# Patient Record
Sex: Female | Born: 1960 | Race: White | Hispanic: No | Smoking: Never smoker
Health system: Southern US, Community
[De-identification: ages and names within clinical notes are randomized; demographics above are authoritative.]

## PROBLEM LIST (undated history)

## (undated) DIAGNOSIS — I10 Essential (primary) hypertension: Secondary | ICD-10-CM

## (undated) DIAGNOSIS — N83209 Unspecified ovarian cyst, unspecified side: Secondary | ICD-10-CM

## (undated) HISTORY — PX: APPENDECTOMY: SHX54

## (undated) HISTORY — DX: Essential (primary) hypertension: I10

## (undated) HISTORY — PX: CHOLECYSTECTOMY: SHX55

## (undated) HISTORY — DX: Unspecified ovarian cyst, unspecified side: N83.209

---

## 2014-01-13 ENCOUNTER — Other Ambulatory Visit: Payer: Self-pay | Admitting: Obstetrics and Gynecology

## 2014-01-13 DIAGNOSIS — N839 Noninflammatory disorder of ovary, fallopian tube and broad ligament, unspecified: Secondary | ICD-10-CM

## 2014-01-27 ENCOUNTER — Telehealth: Payer: Self-pay | Admitting: *Deleted

## 2014-01-27 NOTE — Telephone Encounter (Signed)
[facility] [phone] for [facility], Dr. Hervey Ard Secretary to fax over copy of MRI, prior copy blurry and Path report from Endometrial Biopsy. Paper work needed prior to appt scheduled.

## 2014-02-11 ENCOUNTER — Encounter: Payer: Self-pay | Admitting: Gynecologic Oncology

## 2014-02-13 ENCOUNTER — Encounter: Payer: Self-pay | Admitting: Gynecologic Oncology

## 2014-02-13 ENCOUNTER — Ambulatory Visit: Payer: PRIVATE HEALTH INSURANCE | Attending: Gynecologic Oncology | Admitting: Gynecologic Oncology

## 2014-02-13 VITALS — BP 188/100 | HR 77 | Temp 97.9°F | Resp 16 | Ht 62.05 in | Wt 190.0 lb

## 2014-02-13 DIAGNOSIS — N939 Abnormal uterine and vaginal bleeding, unspecified: Secondary | ICD-10-CM | POA: Insufficient documentation

## 2014-02-13 DIAGNOSIS — N926 Irregular menstruation, unspecified: Secondary | ICD-10-CM | POA: Insufficient documentation

## 2014-02-13 DIAGNOSIS — R1900 Intra-abdominal and pelvic swelling, mass and lump, unspecified site: Secondary | ICD-10-CM

## 2014-02-13 DIAGNOSIS — N9489 Other specified conditions associated with female genital organs and menstrual cycle: Secondary | ICD-10-CM | POA: Insufficient documentation

## 2014-02-13 DIAGNOSIS — N83209 Unspecified ovarian cyst, unspecified side: Secondary | ICD-10-CM | POA: Insufficient documentation

## 2014-02-13 NOTE — Progress Notes (Signed)
Consult Note: Gyn-Onc  Consult was requested by Dr. Jeralene Peters for the evaluation of Rebecca Mills 53 y.o. female  CC:  Chief Complaint  Patient presents with  . Pelvic mass    New Consult    Assessment/Plan:   Ms. Rebecca Mills  is a 53 y.o.  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 CA 125 is within normal limits.  Rebecca Mills also has significant uterine bleeding.  Hysteroscopy was unremarkable and the pathology demonstrated inactive endometrium with stromal breakdown. She continues to have significant uterine bleeding and denies receiving hormonal management. The recommendation made was for hysterectomy bilateral salpingectomy right oophorectomy possible bilateral oophorectomy. The patient is aware that if malignancy is encountered the procedure be extended to be inclusive of omentectomy lymph node dissection debulking another indicated procedures.  The risks and benefits of the procedure were discussed with the patient and her son. All of their questions were answered to their satisfaction.  Rebecca Mills is aware that Dr. Marti Sleigh will be the surgeon for that the procedure will occur on 02/25/2014.   HPI: Ms. Rebecca Mills  is a 53 y.o.  gravida 3 para 3 last normal menstrual period 11/25/2013. Ms. Rebecca Mills reports one year of menorrhagia associated with daily spotting between menses.  She presented to Dr. Nyoka Cowden for evaluation of abnormal uterine bleeding.   On 01/13/2014 she underwent an MRI. Findings were notable for a 10.7 x 7.6 x 8.2 cm multicystic mass in the right adnexal area. The largest cystic component measures 8.3 cm. 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 The left ovary measured 3.7 x 4.1 x 3.6 with numerous multiple follicles. In the left adnexa is no evidence for wall irregularity thickening or nodularity. The uterus was  noted to measure 10.0 x 8.8 x 5.9 cm subserosal fibroids posteriorly.  On 01/23/2014 she underwent hysteroscopy with D&C. The findings were notable for uterus was sounded to 10 cm. The cavitary appeared normal thin menopausal appearing with no endometrial polyps a small benign endocervical polyp was seen and removed. Pathology from that procedure was notable for inactive endometrium with stromal breakdown and endocervical polyp with tubal metaplasia. The endocervical curettage was negative for malignancy. Patient continued to have spotting.  CA 125 on 01/21/2014 returned to value of 28.2.  Ms. Rebecca Mills reports right sided pelvic pain for 1 year the pain is worsened with exertion and heavy lifting. She denies associated nausea. She states there is some radiation of the pain to the right anterior thigh.  She notes an intentional 6 pound weight loss states her appetite is good has occasional bloating is no change in her bowel or bladder habits. Even since the hysteroscopy she continues to have daily if the bleeding   Current Meds:  Outpatient Encounter Prescriptions as of 02/13/2014  Medication Sig  . amLODipine (NORVASC) 10 MG tablet Take 10 mg by mouth daily.  . cloNIDine (CATAPRES) 0.1 MG tablet Take 0.1 mg by mouth daily.  . hydrochlorothiazide (HYDRODIURIL) 25 MG tablet Take 25 mg by mouth daily.  Marland Kitchen losartan (COZAAR) 100 MG tablet Take 100 mg by mouth daily.  . metoprolol (LOPRESSOR) 50 MG tablet Take 50 mg by mouth 2 (two) times daily.  . Multiple Vitamin (MULTI VITAMIN DAILY PO) Take by mouth daily.    Allergy: No Known Allergies  Social Hx:   History   Social History  .  Marital Status: Married    Spouse Name: N/A    Number of Children: N/A  . Years of Education: N/A   Occupational History  . Not on file.   Social History Main Topics  . Smoking status: Never Smoker   . Smokeless tobacco: Not on file  . Alcohol Use: No  . Drug Use: No  . Sexual Activity: Yes   Other  Topics Concern  . Not on file   Social History Narrative  . No narrative on file    Past Surgical Hx:  Past Surgical History  Procedure Laterality Date  . Appendectomy    . Cholecystectomy      Past Medical Hx:  Past Medical History  Diagnosis Date  . Hypertension   . Ovarian cystic mass     Past Gynecological History:  G3P3 Menarche 14 regular menses until 1 year ago.  LNMP 11/2013.  Pap  01/01/2014 was negative HPV negative  Family Hx:  Family History  Problem Relation Age of Onset  . Hypertension Mother   . Thyroid cancer Mother   . Hypertension Father   . Kidney cancer Father   . Hypertension Sister   . Hypertension Brother     Review of Systems: Constitutional  Feels well, Cardiovascular  No chest pain, shortness of breath, or edema  Pulmonary  No cough or wheeze.  Gastro Intestinal  No nausea, vomitting, or diarrhoea. No bright red blood per rectum, RLQ abdominal pain with lifting.  No change in bowel movement, or constipation.  Genito Urinary  No frequency, urgency, dysuria, Continuous vaginal bleeding Musculo Skeletal  No myalgia, arthralgia, joint swelling or pain  Neurologic  No weakness, numbness, change in gait,  Psychology  No depression, anxiety, insomnia.   Vitals:  Blood pressure 188/100, pulse 77, temperature 97.9 F (36.6 C), temperature source Oral, resp. rate 16, height 5' 2.05" (1.576 m), weight 190 lb (86.183 kg).  Physical Exam: WD in NAD Neck  Supple NROM, without any enlargements.  Lymph Node Survey No cervical supraclavicular or inguinal adenopathy Cardiovascular  Pulse normal rate, regularity and rhythm.  Lungs  Clear to auscultation bilateraly,  Psychiatry  Alert and oriented, appropriate mood and affect. Abdomen  Normoactive bowel sounds, abdomen soft, non-tender and obese. No omental cake. Back No CVA tenderness Genito Urinary  Vulva/vagina: Normal external female genitalia.  No lesions. No  discharge.  Bladder/urethra:  No lesions or masses  Vagina: Significant blood in the vault.  Cervix: Normal appearing, no lesions.  Uterus:Unable to assess size no parametrial involvement or nodularity.  Adnexa: No palpable masses. Rectal  Good tone, no masses no cul de sac nodularity.  Extremities  No bilateral cyanosis, clubbing or edema.   Janie Morning, MD, PhD 02/13/2014, 4:46 PM

## 2014-02-13 NOTE — Patient Instructions (Signed)
Plan for surgery on May 5 with Dr. Fermin Schwab at [facility].                Preparing for your Surgery  Pre-operative Testing -You will receive a phone call from presurgical testing at [facility] to arrange for a pre-operative testing appointment before your surgery.  This appointment normally occurs one to two weeks before your scheduled surgery.   -Bring your insurance card, copy of an advanced directive if applicable, medication list  -At that visit, you will be asked to sign a consent for a possible blood transfusion in case a transfusion becomes necessary during surgery.  The need for a blood transfusion is rare but having consent is a necessary part of your care.     Day Before Surgery at [place] will be asked to take in only clear liquids the day before surgery.  Examples of clear liquids include broths, jello, and clear juices.  You will be advised to perform a fleets enema the night before your surgery.  You will be advised to have nothing to eat or drink after midnight the evening before.    Your role in recovery Your role is to become active as soon as directed by your doctor, while still giving yourself time to heal.  Rest when you feel tired. You will be asked to do the following in order to speed your recovery:  - Cough and breathe deeply. This helps toclear and expand your lungs and can prevent pneumonia. You may be given a spirometer to practice deep breathing. A staff member will show you how to use the spirometer. - Do mild physical activity. Walking or moving your legs help your circulation and body functions return to normal. A staff member will help you when you try to walk and will provide you with simple exercises. Do not try to get up or walk alone the first time. - Actively manage your pain. Managing your pain lets you move in comfort. We will ask you to rate your pain on a scale of zero to 10. It is your responsibility to tell your doctor or  nurse where and how much you hurt so your pain can be treated.  Special Considerations -If you are diabetic, you may be placed on insulin after surgery to have closer control over your blood sugars to promote healing and recovery.  This does not mean that you will be discharged on insulin.  If applicable, your oral antidiabetics will be resumed when you are tolerating a solid diet.  -Your final pathology results from surgery should be available by the Friday after surgery and the results will be relayed to you when available.  Fleets Enema What is this medicine? SODIUM PHOSPATE SALT (SOE dee um FOS fate sawlt) is a saline laxative. It is used to treat constipation or to clean the bowel before a colonoscopy. This medicine may be used for other purposes; ask your health care provider or pharmacist if you have questions. COMMON BRAND NAME(S): Fleet, Ready To Use Saline What should I tell my health care provider before I take this medicine? They need to know if you have any of these conditions: -abnormal blood levels of electrolytes like sodium, phosphate, potassium or calcium -bowel problems like colitis, constipation, and obstruction -change in bowel habits lasting more than 2 weeks -chest pain -dehydration -heart failure -kidney disease -on low salt or sodium diet -stomach pain, nausea, vomiting -an unusual or allergic reaction to sodium phosphate, other  medicines, foods, dyes, or preservatives -pregnant or trying to get pregnant -breast-feeding How should I use this medicine? This medicine is for rectal use only. Do not take by mouth. Follow the directions on the prescription label. Wash your hands before and after use. Remove tip from enema bottle. Gently insert enema tip into the rectum. Squeeze bottle until almost all of the medicine is inside the rectum. Remove enema tip from the rectum and stay in position until the urge to evacuate is strong. Do not take doses that are larger than  those recommended on the product label or otherwise directed by your healthcare professional. Do not take more than one dose in 24 hours. Talk to your pediatrician regarding the use of this medicine in children. While this drug may be prescribed for children as young as 37 years old for selected conditions, precautions do apply. Overdosage: If you think you have taken too much of this medicine contact a poison control center or emergency room at once. NOTE: This medicine is only for you. Do not share this medicine with others. What if I miss a dose? This does not apply; this medicine is not for regular use. What may interact with this medicine? -aspirin -certain medicines used to treat high blood pressure, like captopril, enalapril, lisinopril, or candesartan, losartan, valsartan -diuretics -NSAIDS, medicines for pain and inflammation, like ibuprofen or naproxen This list may not describe all possible interactions. Give your health care provider a list of all the medicines, herbs, non-prescription drugs, or dietary supplements you use. Also tell them if you smoke, drink alcohol, or use illegal drugs. Some items may interact with your medicine. What should I watch for while using this medicine? Do not use with any other laxatives unless your doctor tells you to. Drink fluids as directed to prevent dehydration. See your doctor right away if you do not have a bowel movement after using this medicine. What side effects may I notice from receiving this medicine? Side effects that you should report to your doctor or health care professional as soon as possible: -allergic reactions like skin rash, itching or hives, swelling of the face, lips, or tongue -irregular heart beat -rectal bleeding -seizures Side effects that usually do not require medical attention (report to your doctor or health care professional if they continue or are bothersome): -bloating -dizziness -headache -nausea and  vomiting -stomach pain This list may not describe all possible side effects. Call your doctor for medical advice about side effects. You may report side effects to FDA at 1-800-FDA-1088. Where should I keep my medicine? Keep out of the reach of children. Store at room temperature between 15 and 30 degrees C (59 and 86 degrees F). Throw away any unused medicine after the expiration date. NOTE: This sheet is a summary. It may not cover all possible information. If you have questions about this medicine, talk to your doctor, pharmacist, or health care provider.  2014, [employer]. (2012-11-01 14:54:06)

## 2014-02-18 ENCOUNTER — Ambulatory Visit (HOSPITAL_COMMUNITY)
Admission: RE | Admit: 2014-02-18 | Discharge: 2014-02-18 | Disposition: A | Discharge status: Home / Self Care | Payer: PRIVATE HEALTH INSURANCE | Source: Ambulatory Visit | Attending: Anesthesiology | Admitting: Anesthesiology

## 2014-02-18 ENCOUNTER — Encounter (HOSPITAL_COMMUNITY): Payer: Self-pay | Admitting: Pharmacy Technician

## 2014-02-18 ENCOUNTER — Encounter (HOSPITAL_COMMUNITY): Payer: Self-pay

## 2014-02-18 ENCOUNTER — Encounter (HOSPITAL_COMMUNITY)
Admission: RE | Admit: 2014-02-18 | Discharge: 2014-02-18 | Disposition: A | Discharge status: Home / Self Care | Payer: PRIVATE HEALTH INSURANCE | Source: Ambulatory Visit | Attending: Obstetrics & Gynecology | Admitting: Obstetrics & Gynecology

## 2014-02-18 DIAGNOSIS — Z01812 Encounter for preprocedural laboratory examination: Secondary | ICD-10-CM | POA: Insufficient documentation

## 2014-02-18 DIAGNOSIS — Z01818 Encounter for other preprocedural examination: Secondary | ICD-10-CM | POA: Insufficient documentation

## 2014-02-18 DIAGNOSIS — Z01810 Encounter for preprocedural cardiovascular examination: Secondary | ICD-10-CM | POA: Insufficient documentation

## 2014-02-18 LAB — CBC WITH DIFFERENTIAL/PLATELET
BASOS ABS: 0.1 10*3/uL (ref 0.0–0.1)
BASOS PCT: 1 % (ref 0–1)
EOS ABS: 0.4 10*3/uL (ref 0.0–0.7)
EOS PCT: 5 % (ref 0–5)
HEMATOCRIT: 36.7 % (ref 36.0–46.0)
HEMOGLOBIN: 13 g/dL (ref 12.0–15.0)
Lymphocytes Relative: 18 % (ref 12–46)
Lymphs Abs: 1.4 10*3/uL (ref 0.7–4.0)
MCH: 30 pg (ref 26.0–34.0)
MCHC: 35.4 g/dL (ref 30.0–36.0)
MCV: 84.8 fL (ref 78.0–100.0)
MONO ABS: 0.5 10*3/uL (ref 0.1–1.0)
MONOS PCT: 7 % (ref 3–12)
Neutro Abs: 5.4 10*3/uL (ref 1.7–7.7)
Neutrophils Relative %: 70 % (ref 43–77)
Platelets: 237 10*3/uL (ref 150–400)
RBC: 4.33 MIL/uL (ref 3.87–5.11)
RDW: 13.1 % (ref 11.5–15.5)
WBC: 7.8 10*3/uL (ref 4.0–10.5)

## 2014-02-18 LAB — COMPREHENSIVE METABOLIC PANEL
ALBUMIN: 4 g/dL (ref 3.5–5.2)
ALT: 17 U/L (ref 0–35)
AST: 20 U/L (ref 0–37)
Alkaline Phosphatase: 88 U/L (ref 39–117)
BUN: 16 mg/dL (ref 6–23)
CALCIUM: 10.9 mg/dL — AB (ref 8.4–10.5)
CO2: 26 mEq/L (ref 19–32)
CREATININE: 0.84 mg/dL (ref 0.50–1.10)
Chloride: 102 mEq/L (ref 96–112)
GFR calc Af Amer: >90 mL/min (ref >90)
GFR calc non Af Amer: 79 mL/min — ABNORMAL LOW (ref >90)
Glucose, Bld: 112 mg/dL — ABNORMAL HIGH (ref 70–99)
Potassium: 4.1 mEq/L (ref 3.7–5.3)
Sodium: 139 mEq/L (ref 137–147)
TOTAL PROTEIN: 7.4 g/dL (ref 6.0–8.3)
Total Bilirubin: 0.7 mg/dL (ref 0.3–1.2)

## 2014-02-18 LAB — URINALYSIS, ROUTINE W REFLEX MICROSCOPIC
Bilirubin Urine: NEGATIVE
GLUCOSE, UA: NEGATIVE mg/dL
Ketones, ur: NEGATIVE mg/dL
LEUKOCYTES UA: NEGATIVE
Nitrite: NEGATIVE
PROTEIN: NEGATIVE mg/dL
SPECIFIC GRAVITY, URINE: 1.006 (ref 1.005–1.030)
UROBILINOGEN UA: 0.2 mg/dL (ref 0.0–1.0)
pH: 7 (ref 5.0–8.0)

## 2014-02-18 LAB — URINE MICROSCOPIC-ADD ON

## 2014-02-18 LAB — HCG, SERUM, QUALITATIVE: PREG SERUM: NEGATIVE

## 2014-02-18 NOTE — Pre-Procedure Instructions (Signed)
EKG AND CXR WERE DONE TODAY PREOP AT [facility] AS PER ANESTHESIOLOGIST'S GUIDELINES. 

## 2014-02-18 NOTE — Patient Instructions (Signed)
CLEAR LIQUID DIET ALL DAY - THE DAY BEFORE YOUR SURGERY - SEE ATTACHED LIST.  FLEETS ENEMA THE NIGHT BEFORE YOUR SURGERY.  YOUR SURGERY IS SCHEDULED AT [facility]  ON:  Tuesday  5/5  REPORT TO  SHORT STAY CENTER AT:  5:15 AM      PHONE # FOR SHORT STAY IS [phone]  DO NOT EAT OR DRINK ANYTHING AFTER MIDNIGHT THE NIGHT BEFORE YOUR SURGERY.  YOU MAY BRUSH YOUR TEETH, RINSE OUT YOUR MOUTH--BUT NO WATER, NO FOOD, NO CHEWING GUM, NO MINTS, NO CANDIES, NO CHEWING TOBACCO.  PLEASE TAKE THE FOLLOWING MEDICATIONS THE AM OF YOUR SURGERY WITH A FEW SIPS OF WATER:  AMLODIPINE, CATAPRES, METOPROLOL.    DO NOT BRING VALUABLES, MONEY, CREDIT CARDS.  DO NOT WEAR JEWELRY, MAKE-UP, NAIL POLISH AND NO METAL PINS OR CLIPS IN YOUR HAIR. CONTACT LENS, DENTURES / PARTIALS, GLASSES SHOULD NOT BE WORN TO SURGERY AND IN MOST CASES-HEARING AIDS WILL NEED TO BE REMOVED.  BRING YOUR GLASSES CASE, ANY EQUIPMENT NEEDED FOR YOUR CONTACT LENS. FOR PATIENTS ADMITTED TO THE HOSPITAL--CHECK OUT TIME THE DAY OF DISCHARGE IS 11:00 AM.  ALL INPATIENT ROOMS ARE PRIVATE - WITH BATHROOM, TELEPHONE, TELEVISION AND WIFI INTERNET.                                                    PLEASE READ OVER ANY  FACT SHEETS THAT YOU WERE GIVEN: BLOOD TRANSFUSION INFORMATION, INCENTIVE SPIROMETER INFORMATION.  AFTER YOUR SURGERY - REMEMBER TO DEEP BREATHE, COUGH TO HELP PREVENT LUNG COMPLICATIONS,  TURN IN BED FREQUENTLY AND MOVE YOUR LEGS UP AND DOWN TO HELP PREVENT BLOOD CLOTS.  PLEASE BE AWARE THAT YOU MAY NEED ADDITIONAL BLOOD DRAWN DAY OF YOUR SURGERY  _______________________________________________________________________   [facility] - Preparing for Surgery Before surgery, you can play an important role.  Because skin is not sterile, your skin needs to be as free of germs as possible.  You can reduce the number of germs on your skin by washing with CHG (chlorahexidine gluconate) soap before surgery.  CHG is an  antiseptic cleaner which kills germs and bonds with the skin to continue killing germs even after washing. Please DO NOT use if you have an allergy to CHG or antibacterial soaps.  If your skin becomes reddened/irritated stop using the CHG and inform your nurse when you arrive at Short Stay. Do not shave (including legs and underarms) for at least 48 hours prior to the first CHG shower.  You may shave your face. Please follow these instructions carefully:  1.  Shower with CHG Soap the night before surgery and the  morning of Surgery.  2.  If you choose to wash your hair, wash your hair first as usual with your  normal  shampoo.  3.  After you shampoo, rinse your hair and body thoroughly to remove the  shampoo.                           4.  Use CHG as you would any other liquid soap.  You can apply chg directly  to the skin and wash                       Gently with a scrungie or clean washcloth.  5.  Apply the CHG Soap to your body ONLY FROM THE NECK DOWN.   Do not use on open                           Wound or open sores. Avoid contact with eyes, ears mouth and genitals (private parts).                        Genitals (private parts) with your normal soap.             6.  Wash thoroughly, paying special attention to the area where your surgery  will be performed.  7.  Thoroughly rinse your body with warm water from the neck down.  8.  DO NOT shower/wash with your normal soap after using and rinsing off  the CHG Soap.                9.  Pat yourself dry with a clean towel.            10.  Wear clean pajamas.            11.  Place clean sheets on your bed the night of your first shower and do not  sleep with pets. Day of Surgery : Do not apply any lotions/deodorants the morning of surgery.  Please wear clean clothes to the hospital/surgery center.  FAILURE TO FOLLOW THESE INSTRUCTIONS MAY RESULT IN THE CANCELLATION OF YOUR SURGERY PATIENT SIGNATURE_________________________________  NURSE  SIGNATURE__________________________________  ________________________________________________________________________    CLEAR LIQUID DIET   Foods Allowed                                                                     Foods Excluded  Coffee and tea, regular and decaf                             liquids that you cannot  Plain Jell-O in any flavor                                             see through such as: Fruit ices (not with fruit pulp)                                     milk, soups, orange juice  Iced Popsicles                                    All solid food Carbonated beverages, regular and diet                                    Cranberry, grape and apple juices Sports drinks like Gatorade Lightly seasoned clear broth or consume(fat free) Sugar, honey syrup  Sample Menu Breakfast  Lunch                                     Supper Cranberry juice                    Beef broth                            Chicken broth Jell-O                                     Grape juice                           Apple juice Coffee or tea                        Jell-O                                      Popsicle                                                Coffee or tea                        Coffee or tea  _____________________________________________________________________   WHAT IS A BLOOD TRANSFUSION? Blood Transfusion Information  A transfusion is the replacement of blood or some of its parts. Blood is made up of multiple cells which provide different functions.  Red blood cells carry oxygen and are used for blood loss replacement.  White blood cells fight against infection.  Platelets control bleeding.  Plasma helps clot blood.  Other blood products are available for specialized needs, such as hemophilia or other clotting disorders. BEFORE THE TRANSFUSION  Who gives blood for transfusions?   Healthy volunteers who are fully evaluated  to make sure their blood is safe. This is blood bank blood. Transfusion therapy is the safest it has ever been in the practice of medicine. Before blood is taken from a donor, a complete history is taken to make sure that person has no history of diseases nor engages in risky social behavior (examples are intravenous drug use or sexual activity with multiple partners). The donor's travel history is screened to minimize risk of transmitting infections, such as malaria. The donated blood is tested for signs of infectious diseases, such as HIV and hepatitis. The blood is then tested to be sure it is compatible with you in order to minimize the chance of a transfusion reaction. If you or a relative donates blood, this is often done in anticipation of surgery and is not appropriate for emergency situations. It takes many days to process the donated blood. RISKS AND COMPLICATIONS Although transfusion therapy is very safe and saves many lives, the main dangers of transfusion include:   Getting an infectious disease.  Developing a transfusion reaction. This is an allergic reaction to something in the blood you were given. Every precaution is taken to prevent this. The decision to have  a blood transfusion has been considered carefully by your caregiver before blood is given. Blood is not given unless the benefits outweigh the risks. AFTER THE TRANSFUSION  Right after receiving a blood transfusion, you will usually feel much better and more energetic. This is especially true if your red blood cells have gotten low (anemic). The transfusion raises the level of the red blood cells which carry oxygen, and this usually causes an energy increase.  The nurse administering the transfusion will monitor you carefully for complications. HOME CARE INSTRUCTIONS  No special instructions are needed after a transfusion. You may find your energy is better. Speak with your caregiver about any limitations on activity for  underlying diseases you may have. SEEK MEDICAL CARE IF:   Your condition is not improving after your transfusion.  You develop redness or irritation at the intravenous (IV) site. SEEK IMMEDIATE MEDICAL CARE IF:  Any of the following symptoms occur over the next 12 hours:  Shaking chills.  You have a temperature by mouth above 102 F (38.9 C), not controlled by medicine.  Chest, back, or muscle pain.  People around you feel you are not acting correctly or are confused.  Shortness of breath or difficulty breathing.  Dizziness and fainting.  You get a rash or develop hives.  You have a decrease in urine output.  Your urine turns a dark color or changes to pink, red, or brown. Any of the following symptoms occur over the next 10 days:  You have a temperature by mouth above 102 F (38.9 C), not controlled by medicine.  Shortness of breath.  Weakness after normal activity.  The white part of the eye turns yellow (jaundice).  You have a decrease in the amount of urine or are urinating less often.  Your urine turns a dark color or changes to pink, red, or brown. Document Released: 10/07/2000 Document Revised: 01/02/2012 Document Reviewed: 05/26/2008 ExitCare Patient Information 2014 [place].  _______________________________________________________________________  Incentive Spirometer  An incentive spirometer is a tool that can help keep your lungs clear and active. This tool measures how well you are filling your lungs with each breath. Taking long deep breaths may help reverse or decrease the chance of developing breathing (pulmonary) problems (especially infection) following:  A long period of time when you are unable to move or be active. BEFORE THE PROCEDURE   If the spirometer includes an indicator to show your best effort, your nurse or respiratory therapist will set it to a desired goal.  If possible, sit up straight or lean slightly forward. Try not to  slouch.  Hold the incentive spirometer in an upright position. INSTRUCTIONS FOR USE  1. Sit on the edge of your bed if possible, or sit up as far as you can in bed or on a chair. 2. Hold the incentive spirometer in an upright position. 3. Breathe out normally. 4. Place the mouthpiece in your mouth and seal your lips tightly around it. 5. Breathe in slowly and as deeply as possible, raising the piston or the ball toward the top of the column. 6. Hold your breath for 3-5 seconds or for as long as possible. Allow the piston or ball to fall to the bottom of the column. 7. Remove the mouthpiece from your mouth and breathe out normally. 8. Rest for a few seconds and repeat Steps 1 through 7 at least 10 times every 1-2 hours when you are awake. Take your time and take a few normal breaths between  deep breaths. 9. The spirometer may include an indicator to show your best effort. Use the indicator as a goal to work toward during each repetition. 10. After each set of 10 deep breaths, practice coughing to be sure your lungs are clear. If you have an incision (the cut made at the time of surgery), support your incision when coughing by placing a pillow or rolled up towels firmly against it. Once you are able to get out of bed, walk around indoors and cough well. You may stop using the incentive spirometer when instructed by your caregiver.  RISKS AND COMPLICATIONS  Take your time so you do not get dizzy or light-headed.  If you are in pain, you may need to take or ask for pain medication before doing incentive spirometry. It is harder to take a deep breath if you are having pain. AFTER USE  Rest and breathe slowly and easily.  It can be helpful to keep track of a log of your progress. Your caregiver can provide you with a simple table to help with this. If you are using the spirometer at home, follow these instructions: [place] IF:   You are having difficultly using the spirometer.  You  have trouble using the spirometer as often as instructed.  Your pain medication is not giving enough relief while using the spirometer.  You develop fever of 100.5 F (38.1 C) or higher. SEEK IMMEDIATE MEDICAL CARE IF:   You cough up bloody sputum that had not been present before.  You develop fever of 102 F (38.9 C) or greater.  You develop worsening pain at or near the incision site. MAKE SURE YOU:   Understand these instructions.  Will watch your condition.  Will get help right away if you are not doing well or get worse. Document Released: 02/20/2007 Document Revised: 01/02/2012 Document Reviewed: 04/23/2007 [facility] Patient Information 2014 [place], [place].   ________________________________________________________________________

## 2014-02-24 ENCOUNTER — Encounter (HOSPITAL_COMMUNITY): Payer: Self-pay | Admitting: Anesthesiology

## 2014-02-24 ENCOUNTER — Telehealth: Payer: Self-pay | Admitting: *Deleted

## 2014-02-24 NOTE — Anesthesia Preprocedure Evaluation (Addendum)
Anesthesia Evaluation  Patient identified by MRN, date of birth, ID band Patient awake    Reviewed: Allergy & Precautions, H&P , NPO status , Patient's Chart, lab work & pertinent test results  Airway Mallampati: II TM Distance: >3 FB Neck ROM: Full    Dental  (+) Poor Dentition, Dental Advisory Given Poor dentition.  Dental Caries throughout with discoloration:   Pulmonary neg pulmonary ROS,  breath sounds clear to auscultation  Pulmonary exam normal       Cardiovascular hypertension, Pt. on medications Rhythm:Regular Rate:Normal     Neuro/Psych negative neurological ROS  negative psych ROS   GI/Hepatic negative GI ROS, Neg liver ROS,   Endo/Other  negative endocrine ROS  Renal/GU negative Renal ROS  negative genitourinary   Musculoskeletal negative musculoskeletal ROS (+)   Abdominal (+) + obese,   Peds negative pediatric ROS (+)  Hematology negative hematology ROS (+)   Anesthesia Other Findings   Reproductive/Obstetrics negative OB ROS                          Anesthesia Physical Anesthesia Plan  ASA: II  Anesthesia Plan: General   Post-op Pain Management:    Induction: Intravenous  Airway Management Planned: Oral ETT  Additional Equipment:   Intra-op Plan:   Post-operative Plan: Extubation in OR  Informed Consent: I have reviewed the patients History and Physical, chart, labs and discussed the procedure including the risks, benefits and alternatives for the proposed anesthesia with the patient or authorized representative who has indicated his/her understanding and acceptance.   Dental advisory given  Plan Discussed with: CRNA  Anesthesia Plan Comments:         Anesthesia Quick Evaluation

## 2014-02-24 NOTE — Telephone Encounter (Signed)
Called pt with reminder to have clear liquids today, npo after midnight, fleets enema night before

## 2014-02-25 ENCOUNTER — Encounter (HOSPITAL_COMMUNITY): Payer: PRIVATE HEALTH INSURANCE | Admitting: Anesthesiology

## 2014-02-25 ENCOUNTER — Encounter (HOSPITAL_COMMUNITY): Payer: Self-pay | Admitting: *Deleted

## 2014-02-25 ENCOUNTER — Encounter (HOSPITAL_COMMUNITY)
Admission: RE | Disposition: A | Discharge status: Home / Self Care | Payer: Self-pay | Source: Ambulatory Visit | Attending: Obstetrics & Gynecology

## 2014-02-25 ENCOUNTER — Inpatient Hospital Stay (HOSPITAL_COMMUNITY): Payer: PRIVATE HEALTH INSURANCE | Admitting: Anesthesiology

## 2014-02-25 ENCOUNTER — Inpatient Hospital Stay (HOSPITAL_COMMUNITY)
Admission: RE | Admit: 2014-02-25 | Discharge: 2014-02-27 | DRG: 743 | Disposition: A | Discharge status: Home / Self Care | Payer: PRIVATE HEALTH INSURANCE | Source: Ambulatory Visit | Attending: Obstetrics & Gynecology | Admitting: Obstetrics & Gynecology

## 2014-02-25 DIAGNOSIS — Z808 Family history of malignant neoplasm of other organs or systems: Secondary | ICD-10-CM

## 2014-02-25 DIAGNOSIS — Z9089 Acquired absence of other organs: Secondary | ICD-10-CM

## 2014-02-25 DIAGNOSIS — Z8249 Family history of ischemic heart disease and other diseases of the circulatory system: Secondary | ICD-10-CM

## 2014-02-25 DIAGNOSIS — I10 Essential (primary) hypertension: Secondary | ICD-10-CM | POA: Diagnosis present

## 2014-02-25 DIAGNOSIS — N83209 Unspecified ovarian cyst, unspecified side: Secondary | ICD-10-CM

## 2014-02-25 DIAGNOSIS — R1900 Intra-abdominal and pelvic swelling, mass and lump, unspecified site: Secondary | ICD-10-CM | POA: Diagnosis present

## 2014-02-25 DIAGNOSIS — N838 Other noninflammatory disorders of ovary, fallopian tube and broad ligament: Secondary | ICD-10-CM | POA: Diagnosis present

## 2014-02-25 DIAGNOSIS — Z8051 Family history of malignant neoplasm of kidney: Secondary | ICD-10-CM

## 2014-02-25 DIAGNOSIS — N920 Excessive and frequent menstruation with regular cycle: Secondary | ICD-10-CM | POA: Diagnosis present

## 2014-02-25 DIAGNOSIS — D3910 Neoplasm of uncertain behavior of unspecified ovary: Secondary | ICD-10-CM

## 2014-02-25 DIAGNOSIS — N9489 Other specified conditions associated with female genital organs and menstrual cycle: Secondary | ICD-10-CM | POA: Diagnosis present

## 2014-02-25 DIAGNOSIS — D252 Subserosal leiomyoma of uterus: Principal | ICD-10-CM | POA: Diagnosis present

## 2014-02-25 HISTORY — PX: ABDOMINAL HYSTERECTOMY: SHX81

## 2014-02-25 HISTORY — PX: SALPINGOOPHORECTOMY: SHX82

## 2014-02-25 LAB — TYPE AND SCREEN
ABO/RH(D): O POS
Antibody Screen: NEGATIVE

## 2014-02-25 LAB — ABO/RH: ABO/RH(D): O POS

## 2014-02-25 SURGERY — HYSTERECTOMY, ABDOMINAL
Anesthesia: General

## 2014-02-25 MED ORDER — BUPIVACAINE LIPOSOME 1.3 % IJ SUSP
INTRAMUSCULAR | Status: DC | PRN
  Administered 2014-02-25: 20 mL

## 2014-02-25 MED ORDER — GLYCOPYRROLATE 0.2 MG/ML IJ SOLN
INTRAMUSCULAR | Status: AC
  Filled 2014-02-25: qty 3

## 2014-02-25 MED ORDER — MIDAZOLAM HCL 5 MG/5ML IJ SOLN
INTRAMUSCULAR | Status: DC | PRN
  Administered 2014-02-25: 2 mg via INTRAVENOUS

## 2014-02-25 MED ORDER — AMLODIPINE BESYLATE 10 MG PO TABS
10.0000 mg | ORAL_TABLET | Freq: Every morning | ORAL | Status: DC
  Administered 2014-02-26 – 2014-02-27 (×2): 10 mg via ORAL
  Filled 2014-02-25 (×3): qty 1

## 2014-02-25 MED ORDER — PROMETHAZINE HCL 25 MG/ML IJ SOLN: 6.2500 mg | INTRAMUSCULAR | Status: DC | PRN

## 2014-02-25 MED ORDER — ONDANSETRON HCL 4 MG/2ML IJ SOLN
INTRAMUSCULAR | Status: AC
  Filled 2014-02-25: qty 2

## 2014-02-25 MED ORDER — KETOROLAC TROMETHAMINE 30 MG/ML IJ SOLN
30.0000 mg | Freq: Four times a day (QID) | INTRAMUSCULAR | Status: AC
  Administered 2014-02-25 – 2014-02-26 (×4): 30 mg via INTRAVENOUS
  Filled 2014-02-25: qty 2
  Filled 2014-02-25 (×4): qty 1

## 2014-02-25 MED ORDER — PROPOFOL 10 MG/ML IV BOLUS
INTRAVENOUS | Status: DC | PRN
  Administered 2014-02-25: 180 mg via INTRAVENOUS

## 2014-02-25 MED ORDER — ROCURONIUM BROMIDE 100 MG/10ML IV SOLN
INTRAVENOUS | Status: AC
  Filled 2014-02-25: qty 1

## 2014-02-25 MED ORDER — METOPROLOL TARTRATE 50 MG PO TABS
50.0000 mg | ORAL_TABLET | Freq: Two times a day (BID) | ORAL | Status: DC
  Administered 2014-02-25 – 2014-02-27 (×4): 50 mg via ORAL
  Filled 2014-02-25 (×5): qty 1

## 2014-02-25 MED ORDER — NEOSTIGMINE METHYLSULFATE 10 MG/10ML IV SOLN
INTRAVENOUS | Status: AC
  Filled 2014-02-25: qty 1

## 2014-02-25 MED ORDER — HYDROMORPHONE HCL PF 1 MG/ML IJ SOLN
INTRAMUSCULAR | Status: DC | PRN
  Administered 2014-02-25 (×2): 1 mg via INTRAVENOUS

## 2014-02-25 MED ORDER — HYDROMORPHONE HCL PF 1 MG/ML IJ SOLN: 0.2500 mg | INTRAMUSCULAR | Status: DC | PRN

## 2014-02-25 MED ORDER — ONDANSETRON HCL 4 MG PO TABS: 4.0000 mg | ORAL_TABLET | Freq: Four times a day (QID) | ORAL | Status: DC | PRN

## 2014-02-25 MED ORDER — LIDOCAINE HCL (CARDIAC) 20 MG/ML IV SOLN
INTRAVENOUS | Status: DC | PRN
  Administered 2014-02-25: 50 mg via INTRAVENOUS

## 2014-02-25 MED ORDER — ENSURE COMPLETE PO LIQD
237.0000 mL | Freq: Two times a day (BID) | ORAL | Status: DC
  Administered 2014-02-25 – 2014-02-27 (×2): 237 mL via ORAL

## 2014-02-25 MED ORDER — MAGNESIUM HYDROXIDE 400 MG/5ML PO SUSP
30.0000 mL | Freq: Three times a day (TID) | ORAL | Status: AC
  Administered 2014-02-25 – 2014-02-26 (×3): 30 mL via ORAL
  Filled 2014-02-25 (×3): qty 30

## 2014-02-25 MED ORDER — LOSARTAN POTASSIUM 50 MG PO TABS
100.0000 mg | ORAL_TABLET | Freq: Every morning | ORAL | Status: DC
  Administered 2014-02-26 – 2014-02-27 (×2): 100 mg via ORAL
  Filled 2014-02-25 (×3): qty 2

## 2014-02-25 MED ORDER — KETOROLAC TROMETHAMINE 30 MG/ML IJ SOLN
30.0000 mg | Freq: Four times a day (QID) | INTRAMUSCULAR | Status: AC
Start: 2014-02-25 — End: 2014-02-26
  Filled 2014-02-25: qty 2
  Filled 2014-02-25 (×4): qty 1

## 2014-02-25 MED ORDER — MIDAZOLAM HCL 2 MG/2ML IJ SOLN
INTRAMUSCULAR | Status: AC
  Filled 2014-02-25: qty 2

## 2014-02-25 MED ORDER — HYDROMORPHONE HCL PF 2 MG/ML IJ SOLN
INTRAMUSCULAR | Status: AC
  Filled 2014-02-25: qty 1

## 2014-02-25 MED ORDER — PROPOFOL 10 MG/ML IV BOLUS
INTRAVENOUS | Status: AC
  Filled 2014-02-25: qty 20

## 2014-02-25 MED ORDER — CEFAZOLIN SODIUM-DEXTROSE 2-3 GM-% IV SOLR
2.0000 g | INTRAVENOUS | Status: AC
  Administered 2014-02-25: 2 g via INTRAVENOUS

## 2014-02-25 MED ORDER — FENTANYL CITRATE 0.05 MG/ML IJ SOLN
INTRAMUSCULAR | Status: AC
  Filled 2014-02-25: qty 5

## 2014-02-25 MED ORDER — HEPARIN SODIUM (PORCINE) 1000 UNIT/ML IJ SOLN
INTRAMUSCULAR | Status: AC
  Filled 2014-02-25: qty 1

## 2014-02-25 MED ORDER — NEOSTIGMINE METHYLSULFATE 10 MG/10ML IV SOLN
INTRAVENOUS | Status: DC | PRN
  Administered 2014-02-25: 4 mg via INTRAVENOUS

## 2014-02-25 MED ORDER — CEFAZOLIN SODIUM-DEXTROSE 2-3 GM-% IV SOLR
INTRAVENOUS | Status: AC
  Filled 2014-02-25: qty 50

## 2014-02-25 MED ORDER — FENTANYL CITRATE 0.05 MG/ML IJ SOLN
INTRAMUSCULAR | Status: DC | PRN
  Administered 2014-02-25: 100 ug via INTRAVENOUS
  Administered 2014-02-25 (×3): 50 ug via INTRAVENOUS

## 2014-02-25 MED ORDER — KCL IN DEXTROSE-NACL 20-5-0.45 MEQ/L-%-% IV SOLN
INTRAVENOUS | Status: DC
  Administered 2014-02-25 – 2014-02-26 (×2): via INTRAVENOUS
  Filled 2014-02-25 (×3): qty 1000

## 2014-02-25 MED ORDER — CLONIDINE HCL 0.1 MG PO TABS
0.1000 mg | ORAL_TABLET | Freq: Two times a day (BID) | ORAL | Status: DC
  Administered 2014-02-25 – 2014-02-27 (×4): 0.1 mg via ORAL
  Filled 2014-02-25 (×6): qty 1

## 2014-02-25 MED ORDER — ACETAMINOPHEN 500 MG PO TABS
1000.0000 mg | ORAL_TABLET | Freq: Four times a day (QID) | ORAL | Status: DC
  Administered 2014-02-25 – 2014-02-27 (×8): 1000 mg via ORAL
  Filled 2014-02-25 (×12): qty 2

## 2014-02-25 MED ORDER — LACTATED RINGERS IV SOLN
INTRAVENOUS | Status: DC | PRN
  Administered 2014-02-25 (×2): via INTRAVENOUS

## 2014-02-25 MED ORDER — GLYCOPYRROLATE 0.2 MG/ML IJ SOLN
INTRAMUSCULAR | Status: DC | PRN
  Administered 2014-02-25: 0.6 mg via INTRAVENOUS

## 2014-02-25 MED ORDER — SUCCINYLCHOLINE CHLORIDE 20 MG/ML IJ SOLN
INTRAMUSCULAR | Status: DC | PRN
  Administered 2014-02-25: 100 mg via INTRAVENOUS

## 2014-02-25 MED ORDER — HYDROMORPHONE HCL PF 1 MG/ML IJ SOLN: 0.5000 mg | INTRAMUSCULAR | Status: DC | PRN

## 2014-02-25 MED ORDER — LIDOCAINE HCL (CARDIAC) 20 MG/ML IV SOLN
INTRAVENOUS | Status: AC
  Filled 2014-02-25: qty 5

## 2014-02-25 MED ORDER — ONDANSETRON HCL 4 MG/2ML IJ SOLN: 4.0000 mg | Freq: Four times a day (QID) | INTRAMUSCULAR | Status: DC | PRN

## 2014-02-25 MED ORDER — BUPIVACAINE LIPOSOME 1.3 % IJ SUSP
20.0000 mL | Freq: Once | INTRAMUSCULAR | Status: DC
  Filled 2014-02-25: qty 20

## 2014-02-25 MED ORDER — DEXAMETHASONE SODIUM PHOSPHATE 10 MG/ML IJ SOLN
INTRAMUSCULAR | Status: AC
  Filled 2014-02-25: qty 1

## 2014-02-25 MED ORDER — ROCURONIUM BROMIDE 100 MG/10ML IV SOLN
INTRAVENOUS | Status: DC | PRN
  Administered 2014-02-25: 25 mg via INTRAVENOUS
  Administered 2014-02-25: 5 mg via INTRAVENOUS

## 2014-02-25 MED ORDER — SODIUM CHLORIDE 0.9 % IJ SOLN
INTRAMUSCULAR | Status: AC
  Filled 2014-02-25: qty 20

## 2014-02-25 MED ORDER — ONDANSETRON HCL 4 MG/2ML IJ SOLN
INTRAMUSCULAR | Status: DC | PRN
  Administered 2014-02-25: 4 mg via INTRAVENOUS

## 2014-02-25 MED ORDER — DEXAMETHASONE SODIUM PHOSPHATE 10 MG/ML IJ SOLN
INTRAMUSCULAR | Status: DC | PRN
  Administered 2014-02-25: 10 mg via INTRAVENOUS

## 2014-02-25 SURGICAL SUPPLY — 41 items
ATTRACTOMAT 16X20 MAGNETIC DRP (DRAPES) ×4 IMPLANT
BLADE EXTENDED COATED 6.5IN (ELECTRODE) ×4 IMPLANT
CANISTER SUCTION 2500CC (MISCELLANEOUS) ×4 IMPLANT
CHLORAPREP W/TINT 26ML (MISCELLANEOUS) ×4 IMPLANT
CLIP TI MEDIUM LARGE 6 (CLIP) IMPLANT
CONT SPEC 4OZ CLIKSEAL STRL BL (MISCELLANEOUS) ×4 IMPLANT
COVER SURGICAL LIGHT HANDLE (MISCELLANEOUS) ×4 IMPLANT
DRAPE WARM FLUID 44X44 (DRAPE) ×4 IMPLANT
ELECT REM PT RETURN 9FT ADLT (ELECTROSURGICAL) ×4
ELECTRODE REM PT RTRN 9FT ADLT (ELECTROSURGICAL) ×2 IMPLANT
GAUZE SPONGE 4X4 16PLY XRAY LF (GAUZE/BANDAGES/DRESSINGS) ×4 IMPLANT
GLOVE BIO SURGEON STRL SZ 6.5 (GLOVE) ×3 IMPLANT
GLOVE BIO SURGEONS STRL SZ 6.5 (GLOVE) ×1
GLOVE BIOGEL M STRL SZ7.5 (GLOVE) ×20 IMPLANT
GOWN STRL REUS W/TWL LRG LVL3 (GOWN DISPOSABLE) ×4 IMPLANT
GOWN STRL REUS W/TWL XL LVL3 (GOWN DISPOSABLE) ×4 IMPLANT
KIT BASIN OR (CUSTOM PROCEDURE TRAY) ×4 IMPLANT
NS IRRIG 1000ML POUR BTL (IV SOLUTION) ×16 IMPLANT
PACK GENERAL/GYN (CUSTOM PROCEDURE TRAY) ×4 IMPLANT
SHEET LAVH (DRAPES) ×4 IMPLANT
SPONGE GAUZE 4X4 12PLY (GAUZE/BANDAGES/DRESSINGS) ×4 IMPLANT
SPONGE LAP 18X18 X RAY DECT (DISPOSABLE) ×8 IMPLANT
STAPLER VISISTAT 35W (STAPLE) ×4 IMPLANT
SUT ETHILON 1 LR 30 (SUTURE) IMPLANT
SUT PDS AB 1 CTXB1 36 (SUTURE) ×8 IMPLANT
SUT SILK 2 0 (SUTURE) ×2
SUT SILK 2 0 30  PSL (SUTURE)
SUT SILK 2 0 30 PSL (SUTURE) IMPLANT
SUT SILK 2-0 18XBRD TIE 12 (SUTURE) ×2 IMPLANT
SUT VIC AB 0 CT1 36 (SUTURE) ×12 IMPLANT
SUT VIC AB 2-0 CT2 27 (SUTURE) ×32 IMPLANT
SUT VIC AB 2-0 SH 27 (SUTURE) ×12
SUT VIC AB 2-0 SH 27X BRD (SUTURE) ×12 IMPLANT
SUT VIC AB 3-0 CTX 36 (SUTURE) ×8 IMPLANT
SUT VICRYL 2 0 18  UND BR (SUTURE) ×2
SUT VICRYL 2 0 18 UND BR (SUTURE) ×2 IMPLANT
TAPE CLOTH SURG 4X10 WHT LF (GAUZE/BANDAGES/DRESSINGS) ×4 IMPLANT
TOWEL OR 17X26 10 PK STRL BLUE (TOWEL DISPOSABLE) ×8 IMPLANT
TOWEL OR NON WOVEN STRL DISP B (DISPOSABLE) ×4 IMPLANT
TRAY FOLEY CATH 14FRSI W/METER (CATHETERS) ×4 IMPLANT
WATER STERILE IRR 1500ML POUR (IV SOLUTION) IMPLANT

## 2014-02-25 NOTE — Transfer of Care (Signed)
Immediate Anesthesia Transfer of Care Note  Patient: Rebecca Mills  Procedure(s) Performed: Procedure(s) with comments: EXPLORATORY LAPAROTOMY, TOTAL HYSTERECTOMY ABDOMINAL,BILATERAL SALPINGO OOPHERECTOMY (N/A) - ADMIT TO DR. JACKSON MOORE  BILATERAL SALPINGO OOPHORECTOMY (Bilateral)  Patient Location: PACU  Anesthesia Type:General  Level of Consciousness: awake, alert  and oriented  Airway & Oxygen Therapy: Patient Spontanous Breathing and Patient connected to face mask oxygen  Post-op Assessment: Report given to PACU RN and Post -op Vital signs reviewed and stable  Post vital signs: Reviewed and stable  Complications: No apparent anesthesia complications

## 2014-02-25 NOTE — Op Note (Signed)
Rebecca Mills  female MEDICAL RECORD [id number] DATE OF BIRTH: [birth year] PHYSICIAN: Marti Sleigh, M.D  02/25/2014   OPERATIVE REPORT  PREOPERATIVE DIAGNOSIS: Complex right adnexal mass. Abnormal uterine bleeding.  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 Left paratubal cyst.  PROCEDURE: Total abdominal hysterectomy, bilateral salpingo-oophorectomy, omental biopsy  SURGEON: Marti Sleigh, M.D ASSISTANT: Lahoma Crocker M.D. ANESTHESIA: Gen. with oral tracheal tube ESTIMATED BLOOD LOSS: 100 mL  SURGICAL FINDINGS: At the time of exploratory laparotomy there were multiple omental adhesions to the pelvis and ovarian mass were lysed. Uterus had multiple subserosal fibroids. The right ovarian mass approximately 10 cm and aside from some omental adhesions was not adherent to any other pelvic structures. Frozen section of the mass revealed a low malignant potential (borderline) tumor. There appeared to be a left paratubal cyst. The omentum was adherent in the upper abdomen to the prior cholecystectomy scar.  PROCEDURE: The patient was brought to the operating room and after satisfactory attainment of general anesthesia was placed in a modified lithotomy position in [place]. The anterior abdominal wall, perineum and vagina were prepped, Foley catheter was inserted, the patient was draped. A time-out was taken, SCDs were in place, prophylactic antibiotics were administered.  The abdomen was entered through a vertical incision. Peritoneal washings were taken and sent to cytopathology.   Bookwalter retractor was assembled and  the small bowel and sigmoid colon were elevated out of the pelvis thus exposing the uterus. The uterus was grasped with two long Kelly clamps.  The right round ligament was divided the retroperitoneal spaces opened. The iliac vessels and ureter were identified. The ovarian vessels were skeletonized clamped,  cut, free tied and suture ligated. The right ovarian mass was submitted to frozen section with the above-noted findings.  Similar procedures performed left side the pelvis. The bladder flap was advanced with sharp and blunt dissection. Uterine vessels were skeletonized then clamped cut and suture ligated. In the paracervical and cardinal ligaments were clamped, cut and suture ligated. Vaginal angles were encountered, crossclamped and the vagina transected from its connection to the cervix. The uterus, cervix,tubes and ovaries were handed off the operative field. The vaginal angles were transfixed with 0 Vicryl the central portion of vagina closed with interrupted figure-of-eight sutures of 0 Vicryl. The pelvis was irrigated and hemostasis was ascertained.  The retractor was taken down   The abdomen and pelvis were irrigated.  The anterior abdominal wall was closed in layers the first being a running mass closure using #1 PDS. Subcutaneous tissue was irrigated and hemostasis achieved with cautery. Experal (266 mg diluted in 40 ml saline)was injected into the subcutaneous layer. Skin was closed with skin staples. A dressing was applied.  Patient was awakened from anesthesia and taken to the recovery room in satisfactory condition. Sponge needle and instrument counts correct times two.   Marti Sleigh, M.D

## 2014-02-25 NOTE — Anesthesia Postprocedure Evaluation (Signed)
  Anesthesia Post-op Note  Patient: Rebecca Mills  Procedure(s) Performed: Procedure(s) (LRB): EXPLORATORY LAPAROTOMY, TOTAL HYSTERECTOMY ABDOMINAL,BILATERAL SALPINGO OOPHERECTOMY (N/A) BILATERAL SALPINGO OOPHORECTOMY (Bilateral)  Patient Location: PACU  Anesthesia Type: General  Level of Consciousness: awake and alert   Airway and Oxygen Therapy: Patient Spontanous Breathing  Post-op Pain: mild  Post-op Assessment: Post-op Vital signs reviewed, Patient's Cardiovascular Status Stable, Respiratory Function Stable, Patent Airway and No signs of Nausea or vomiting  Last Vitals:  Filed Vitals:   02/25/14 1215  BP: 167/76  Pulse: 81  Temp: 36.6 C  Resp: 14    Post-op Vital Signs: stable   Complications: No apparent anesthesia complications

## 2014-02-25 NOTE — H&P (View-Only) (Signed)
Consult Note: Gyn-Onc  Consult was requested by Dr. Jeralene Peters for the evaluation of Rebecca Mills 53 y.o. female  CC:  Chief Complaint  Patient presents with  . Pelvic mass    New Consult    Assessment/Plan:   Rebecca Mills  is a 53 y.o.  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 CA 125 is within normal limits.  Rebecca Mills also has significant uterine bleeding.  Hysteroscopy was unremarkable and the pathology demonstrated inactive endometrium with stromal breakdown. She continues to have significant uterine bleeding and denies receiving hormonal management. The recommendation made was for hysterectomy bilateral salpingectomy right oophorectomy possible bilateral oophorectomy. The patient is aware that if malignancy is encountered the procedure be extended to be inclusive of omentectomy lymph node dissection debulking another indicated procedures.  The risks and benefits of the procedure were discussed with the patient and her son. All of their questions were answered to their satisfaction.  Rebecca Mills is aware that Dr. Marti Sleigh will be the surgeon for that the procedure will occur on 02/25/2014.   HPI: Rebecca Mills  is a 53 y.o.  gravida 3 para 3 last normal menstrual period 11/25/2013. Rebecca Mills reports one year of menorrhagia associated with daily spotting between menses.  She presented to Dr. Nyoka Cowden for evaluation of abnormal uterine bleeding.   On 01/13/2014 she underwent an MRI. Findings were notable for a 10.7 x 7.6 x 8.2 cm multicystic mass in the right adnexal area. The largest cystic component measures 8.3 cm. 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 The left ovary measured 3.7 x 4.1 x 3.6 with numerous multiple follicles. In the left adnexa is no evidence for wall irregularity thickening or nodularity. The uterus was  noted to measure 10.0 x 8.8 x 5.9 cm subserosal fibroids posteriorly.  On 01/23/2014 she underwent hysteroscopy with D&C. The findings were notable for uterus was sounded to 10 cm. The cavitary appeared normal thin menopausal appearing with no endometrial polyps a small benign endocervical polyp was seen and removed. Pathology from that procedure was notable for inactive endometrium with stromal breakdown and endocervical polyp with tubal metaplasia. The endocervical curettage was negative for malignancy. Patient continued to have spotting.  CA 125 on 01/21/2014 returned to value of 28.2.  Rebecca Mills reports right sided pelvic pain for 1 year the pain is worsened with exertion and heavy lifting. She denies associated nausea. She states there is some radiation of the pain to the right anterior thigh.  She notes an intentional 6 pound weight loss states her appetite is good has occasional bloating is no change in her bowel or bladder habits. Even since the hysteroscopy she continues to have daily if the bleeding   Current Meds:  Outpatient Encounter Prescriptions as of 02/13/2014  Medication Sig  . amLODipine (NORVASC) 10 MG tablet Take 10 mg by mouth daily.  . cloNIDine (CATAPRES) 0.1 MG tablet Take 0.1 mg by mouth daily.  . hydrochlorothiazide (HYDRODIURIL) 25 MG tablet Take 25 mg by mouth daily.  Marland Kitchen losartan (COZAAR) 100 MG tablet Take 100 mg by mouth daily.  . metoprolol (LOPRESSOR) 50 MG tablet Take 50 mg by mouth 2 (two) times daily.  . Multiple Vitamin (MULTI VITAMIN DAILY PO) Take by mouth daily.    Allergy: No Known Allergies  Social Hx:   History   Social History  .  Marital Status: Married    Spouse Name: N/A    Number of Children: N/A  . Years of Education: N/A   Occupational History  . Not on file.   Social History Main Topics  . Smoking status: Never Smoker   . Smokeless tobacco: Not on file  . Alcohol Use: No  . Drug Use: No  . Sexual Activity: Yes   Other  Topics Concern  . Not on file   Social History Narrative  . No narrative on file    Past Surgical Hx:  Past Surgical History  Procedure Laterality Date  . Appendectomy    . Cholecystectomy      Past Medical Hx:  Past Medical History  Diagnosis Date  . Hypertension   . Ovarian cystic mass     Past Gynecological History:  G3P3 Menarche 14 regular menses until 1 year ago.  LNMP 11/2013.  Pap  01/01/2014 was negative HPV negative  Family Hx:  Family History  Problem Relation Age of Onset  . Hypertension Mother   . Thyroid cancer Mother   . Hypertension Father   . Kidney cancer Father   . Hypertension Sister   . Hypertension Brother     Review of Systems: Constitutional  Feels well, Cardiovascular  No chest pain, shortness of breath, or edema  Pulmonary  No cough or wheeze.  Gastro Intestinal  No nausea, vomitting, or diarrhoea. No bright red blood per rectum, RLQ abdominal pain with lifting.  No change in bowel movement, or constipation.  Genito Urinary  No frequency, urgency, dysuria, Continuous vaginal bleeding Musculo Skeletal  No myalgia, arthralgia, joint swelling or pain  Neurologic  No weakness, numbness, change in gait,  Psychology  No depression, anxiety, insomnia.   Vitals:  Blood pressure 188/100, pulse 77, temperature 97.9 F (36.6 C), temperature source Oral, resp. rate 16, height 5' 2.05" (1.576 m), weight 190 lb (86.183 kg).  Physical Exam: WD in NAD Neck  Supple NROM, without any enlargements.  Lymph Node Survey No cervical supraclavicular or inguinal adenopathy Cardiovascular  Pulse normal rate, regularity and rhythm.  Lungs  Clear to auscultation bilateraly,  Psychiatry  Alert and oriented, appropriate mood and affect. Abdomen  Normoactive bowel sounds, abdomen soft, non-tender and obese. No omental cake. Back No CVA tenderness Genito Urinary  Vulva/vagina: Normal external female genitalia.  No lesions. No  discharge.  Bladder/urethra:  No lesions or masses  Vagina: Significant blood in the vault.  Cervix: Normal appearing, no lesions.  Uterus:Unable to assess size no parametrial involvement or nodularity.  Adnexa: No palpable masses. Rectal  Good tone, no masses no cul de sac nodularity.  Extremities  No bilateral cyanosis, clubbing or edema.   Janie Morning, MD, PhD 02/13/2014, 4:46 PM

## 2014-02-25 NOTE — Interval H&P Note (Signed)
History and Physical Interval Note:  02/25/2014 7:15 AM  Rebecca Mills  has presented today for surgery, with the diagnosis of pelvic mass  The various methods of treatment have been discussed with the patient and family. After consideration of risks, benefits and other options for treatment, the patient has consented to  Procedure(s) with comments: EXPLORATORY LAPAROTOMY, TOTAL HYSTERECTOMY ABDOMINAL,BILATERAL SALPINGO OOPHERECTOMY (N/A) - ADMIT TO DR. JACKSON MOORE  BILATERAL SALPINGO OOPHORECTOMY (Bilateral) as a surgical intervention .  The patient's history has been reviewed, patient examined, no change in status, stable for surgery.  I have reviewed the patient's chart and labs.  Questions were answered to the patient's satisfaction.     [place]

## 2014-02-26 LAB — BASIC METABOLIC PANEL
BUN: 13 mg/dL (ref 6–23)
CO2: 26 mEq/L (ref 19–32)
CREATININE: 0.86 mg/dL (ref 0.50–1.10)
Calcium: 10.1 mg/dL (ref 8.4–10.5)
Chloride: 99 mEq/L (ref 96–112)
GFR, EST AFRICAN AMERICAN: 88 mL/min — AB (ref >90)
GFR, EST NON AFRICAN AMERICAN: 76 mL/min — AB (ref >90)
GLUCOSE: 133 mg/dL — AB (ref 70–99)
POTASSIUM: 3.9 meq/L (ref 3.7–5.3)
Sodium: 137 mEq/L (ref 137–147)

## 2014-02-26 LAB — CBC
HCT: 34.2 % — ABNORMAL LOW (ref 36.0–46.0)
HEMOGLOBIN: 12.2 g/dL (ref 12.0–15.0)
MCH: 30.2 pg (ref 26.0–34.0)
MCHC: 35.7 g/dL (ref 30.0–36.0)
MCV: 84.7 fL (ref 78.0–100.0)
Platelets: 227 10*3/uL (ref 150–400)
RBC: 4.04 MIL/uL (ref 3.87–5.11)
RDW: 12.8 % (ref 11.5–15.5)
WBC: 14.2 10*3/uL — ABNORMAL HIGH (ref 4.0–10.5)

## 2014-02-26 MED ORDER — IBUPROFEN 600 MG PO TABS
600.0000 mg | ORAL_TABLET | Freq: Three times a day (TID) | ORAL | Status: DC
  Administered 2014-02-26 – 2014-02-27 (×4): 600 mg via ORAL
  Filled 2014-02-26 (×7): qty 1

## 2014-02-26 NOTE — Care Management Note (Signed)
    Page 1 of 1   02/26/2014     10:09:22 AM CARE MANAGEMENT NOTE 02/26/2014  Patient:  Rebecca Mills, Rebecca Mills   Account Number:  [id number]  Date Initiated:  02/26/2014  Documentation initiated by:  Sunday Spillers  Subjective/Objective Assessment:   53 yo female admitted s/p Total abdominal hysterectomy, bilateral salpingo-oophorectomy, omental biopsy. PTA lived at home with son.     Action/Plan:   Home when stable   Anticipated DC Date:  03/01/2014   Anticipated DC Plan:  [place]  CM consult      Choice offered to / List presented to:             Status of service:  Completed, signed off Medicare Important Message given?  NA - LOS <3 / Initial given by admissions (If response is "NO", the following Medicare IM given date fields will be blank) Date Medicare IM given:   Date Additional Medicare IM given:    Discharge Disposition:  HOME/SELF CARE  Per UR Regulation:  Reviewed for med. necessity/level of care/duration of stay  If discussed at [place] of Stay Meetings, dates discussed:    Comments:

## 2014-02-26 NOTE — Progress Notes (Signed)
1 Day Post-Op Procedure(s) (LRB): EXPLORATORY LAPAROTOMY, TOTAL HYSTERECTOMY ABDOMINAL,BILATERAL SALPINGO OOPHERECTOMY (N/A) BILATERAL SALPINGO OOPHORECTOMY (Bilateral)  Subjective: Patient reports doing well.  No pain reported.  Tolerating solid food.  Bowel movement this am.  Voiding without difficulty. Denies chest pain or dyspnea.  No concerns voiced.    Objective: Vital signs in last 24 hours: Temp:  [97.9 F (36.6 C)-98.5 F (36.9 C)] 98.2 F (36.8 C) (05/06 1037) Pulse Rate:  [67-99] 76 (05/06 1037) Resp:  [14-18] 16 (05/06 1037) BP: (151-179)/(68-96) 151/84 mmHg (05/06 1037) SpO2:  [96 %-99 %] 99 % (05/06 1037) Last BM Date: 02/24/14  Intake/Output from previous day: 05/05 0701 - 05/06 0700 In: 4280 [P.O.:1680; I.V.:2600] Out: 1900 [Urine:1800; Blood:100]  Physical Examination: General: alert, cooperative and no distress Resp: clear to auscultation bilaterally Cardio: regular rate and rhythm, S1, S2 normal, no murmur, click, rub or gallop GI: soft, non-tender; bowel sounds normal; no masses,  no organomegaly and incision: abdomen incision with staples, dressing removed, no drainage or erythema Extremities: extremities normal, atraumatic, no cyanosis or edema  Labs: WBC/Hgb/Hct/Plts:  14.2/12.2/34.2/227 (05/06 3662) BUN/Cr/glu/ALT/AST/amyl/lip:  13/0.86/--/--/--/--/-- (05/06 0418)  Assessment: 53 y.o. s/p Procedure(s): EXPLORATORY LAPAROTOMY, TOTAL HYSTERECTOMY ABDOMINAL,BILATERAL SALPINGO OOPHERECTOMY BILATERAL SALPINGO OOPHORECTOMY: stable Pain:  Pain is well-controlled on PRN medications.  Heme: Hgb 12.2 and Hct 34.2 this am.  Stable post-operatively.  CV: Hx HTN.  Currently on Norvasc and Clonidine.  GI:  Tolerating po: Yes     GU:  Adequate output reported.  Voiding since foley removal.  FEN: Stable post-operatively.  Prophylaxis: intermittent pneumatic compression boots.  Plan: Saline lock IV Encourage ambulation, IS use, deep breathing, and  coughing Continue plan of care per Dr. Delsa Sale The patient is to be discharged to home.   LOS: 1 day    Dorothyann Gibbs 02/26/2014, 10:58 AM

## 2014-02-27 ENCOUNTER — Encounter (HOSPITAL_COMMUNITY): Payer: Self-pay | Admitting: Gynecology

## 2014-02-27 NOTE — Discharge Instructions (Signed)
02/27/2014  Return to work: 4-6 weeks  Activity: 1. Be up and out of the bed during the day.  Take a nap if needed.  You may walk up steps but be careful and use the hand rail.  Stair climbing will tire you more than you think, you may need to stop part way and rest.   2. No lifting or straining for 6 weeks.  3. No driving for 2 weeks.  Do not drive if you are taking narcotic pain medicine.  4. Shower daily.  Use soap and water on your incision and pat dry; don't rub.   5. No sexual activity and nothing in the vagina for 6 weeks.  6.  You may use Tylenol or Ibuprofen for pain at home.  Diet: 1. Low sodium Heart Healthy Diet is recommended.  2. It is safe to use a laxative if you have difficulty moving your bowels.   Wound Care: 1. Keep clean and dry.  Shower daily.  Reasons to call the Doctor:  Fever - Oral temperature greater than 100.4 degrees Fahrenheit  Foul-smelling vaginal discharge  Difficulty urinating  Nausea and vomiting  Increased pain at the site of the incision that is unrelieved with pain medicine.  Difficulty breathing with or without chest pain  New calf pain especially if only on one side  Sudden, continuing increased vaginal bleeding with or without clots.   Contacts: For questions or concerns you should contact:  Dr. Lahoma Crocker at [phone]  Dr. Fermin Schwab at [place]  Joylene John, NP at [phone]

## 2014-02-27 NOTE — Discharge Summary (Signed)
Physician Discharge Summary  Patient ID: Rebecca Mills MRN: [id number] DOB/AGE: 53/11/1960 53 y.o.  Admit date: 02/25/2014 Discharge date: 02/27/2014  Admission Diagnoses: Pelvic mass  Discharge Diagnoses:  Principal Problem:   Pelvic mass   Discharged Condition:  The patient is in good condition and stable for discharge.    Hospital Course: On 02/25/2014, the patient underwent the following: Procedure(s): EXPLORATORY LAPAROTOMY, TOTAL HYSTERECTOMY ABDOMINAL,BILATERAL SALPINGO OOPHERECTOMY BILATERAL SALPINGO OOPHORECTOMY.  The postoperative course was uneventful.  She was discharged to home on postoperative day 2 tolerating a regular diet and having bowel movements.  Consults: None  Significant Diagnostic Studies: None  Treatments: surgery: see above  Discharge Exam: Blood pressure 150/88, pulse 68, temperature 98.1 F (36.7 C), temperature source Oral, resp. rate 18, height 5\' 2"  (1.575 m), weight 195 lb 3.1 oz (88.54 kg), last menstrual period 11/28/2013, SpO2 95.00%. General appearance: alert, cooperative and no distress Resp: clear to auscultation bilaterally Cardio: regular rate and rhythm, S1, S2 normal, no murmur, click, rub or gallop GI: soft, non-tender; bowel sounds normal; no masses,  no organomegaly Extremities: extremities normal, atraumatic, no cyanosis or edema Incision/Wound: Midline incision with staples, lightly bruised, no drainage   Disposition: Home      Discharge Orders   Future Appointments Provider Department Dept Phone   03/04/2014 11:00 AM Dorothyann Gibbs, NP [facility] [phone]   03/28/2014 10:45 AM Alvino Chapel, MD [facility] [phone]   Future Orders Complete By Expires   Call MD for:  difficulty breathing, headache or visual disturbances  As directed    Call MD for:  extreme fatigue  As directed    Call MD for:  hives  As directed    Call MD for:   persistant dizziness or light-headedness  As directed    Call MD for:  persistant nausea and vomiting  As directed    Call MD for:  redness, tenderness, or signs of infection (pain, swelling, redness, odor or green/yellow discharge around incision site)  As directed    Call MD for:  severe uncontrolled pain  As directed    Call MD for:  temperature >100.4  As directed    Diet - low sodium heart healthy  As directed    Discharge instructions  As directed    Driving Restrictions  As directed    Increase activity slowly  As directed    Lifting restrictions  As directed    Sexual Activity Restrictions  As directed        Medication List         amLODipine 10 MG tablet  Commonly known as:  NORVASC  Take 10 mg by mouth every morning.     cloNIDine 0.1 MG tablet  Commonly known as:  CATAPRES  Take 0.1 mg by mouth 2 (two) times daily.     hydrochlorothiazide 25 MG tablet  Commonly known as:  HYDRODIURIL  Take 25 mg by mouth every morning.     losartan 100 MG tablet  Commonly known as:  COZAAR  Take 100 mg by mouth every morning.     metoprolol 50 MG tablet  Commonly known as:  LOPRESSOR  Take 50 mg by mouth 2 (two) times daily.     multivitamin with minerals Tabs tablet  Take 1 tablet by mouth daily.       Follow-up Information   Follow up with CROSS, MELISSA DEAL, NP On 03/04/2014. ([facility]    Specialty:  Gynecologic Oncology   Contact information:   [place] [address] [phone]       Follow up with Alvino Chapel, MD On 03/28/2014. (10:45am at the [facility] for post-op check)    Specialty:  Gynecology   Contact information:   [place] [place] [address] [phone]       Greater than thirty minutes were spend for face to face discharge instructions and discharge orders/summary in EPIC.   Signed: Dorothyann Gibbs 02/27/2014, 11:09 AM

## 2014-03-04 ENCOUNTER — Ambulatory Visit: Payer: PRIVATE HEALTH INSURANCE | Attending: Gynecologic Oncology | Admitting: Gynecologic Oncology

## 2014-03-04 VITALS — BP 168/83 | HR 63 | Temp 97.7°F | Resp 16 | Wt 187.6 lb

## 2014-03-04 DIAGNOSIS — R1900 Intra-abdominal and pelvic swelling, mass and lump, unspecified site: Secondary | ICD-10-CM

## 2014-03-04 DIAGNOSIS — D3911 Neoplasm of uncertain behavior of right ovary: Secondary | ICD-10-CM | POA: Insufficient documentation

## 2014-03-04 NOTE — Progress Notes (Signed)
Follow Up Note: Gyn-Onc  [name] 53 y.o. female  CC:  Chief Complaint  Patient presents with  . Post-operative follow up    Staple removal    HPI:  Rebecca Mills is a 53 year old, gravida 3 para 3, last normal menstrual period 11/25/2013. Rebecca Mills reports one year of menorrhagia associated with daily spotting between menses. She presented to Dr. Nyoka Cowden for evaluation of abnormal uterine bleeding. On 01/13/2014, she underwent an MRI. Findings were notable for a 10.7 x 7.6 x 8.2 cm multicystic mass in the right adnexal area. The largest cystic component measures 8.3 cm. 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 The left ovary measured 3.7 x 4.1 x 3.6 with numerous multiple follicles. In the left adnexa, there was no evidence for wall irregularity thickening or nodularity. The uterus was noted to measure 10.0 x 8.8 x 5.9 cm subserosal fibroids posteriorly.  On 01/23/2014, she underwent a hysteroscopy with D&C. The findings were notable for the uterus sounding to 10 cm. The cavitary appeared normal thin menopausal appearing with no endometrial polyps a small benign endocervical polyp was seen and removed. Pathology from that procedure was notable for inactive endometrium with stromal breakdown and endocervical polyp with tubal metaplasia. The endocervical curettage was negative for malignancy. Patient continued to have spotting.  CA 125 on 01/21/2014 returned to value of 28.2.  During her visit with Dr. Skeet Latch, she reported right sided pelvic pain for 1 year with the pain is worsening with exertion and heavy lifting. She denied associated nausea. She states there is some radiation of the pain to the right anterior thigh. She noted an intentional 6 pound weight loss and stated her appetite was good.  She had occasional bloating with no change in her bowel or bladder habits. Even since the hysteroscopy, she  continues to have daily bleeding.  On 02/25/14, she underwent a total abdominal hysterectomy, bilateral salpingo-oophorectomy, omental biopsy by Dr. Fermin Schwab.  Her post-operative course was uneventful.  Final pathology revealed: 1. 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 - PLEASE SEE COMMENT. 2. 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 - ENDOMETRIUM: BENIGN WEAKLY PROLIFERATIVE ENDOMETRIUM, NO ATYPIA, HYPERPLASIA OR MALIGNANCY. - CERVIX: BENIGN SQUAMOUS MUCOSA AND ENDOCERVICAL MUCOSA, NO DYSPLASIA OR MALIGNANCY.  3. Omentum, resection for tumor - MATURE ADIPOSE TISSUE WITH HEMORRHAGE. - NO EVIDENCE OF ATYPIA OR MALIGNANCY.  4. Ovary and fallopian tube, left - SEROUS BORDERLINE TUMOR, 3.5 CM. - BENIGN FALLOPIAN TUBAL TISSUE.  PERITONEAL WASHING, (SPECIMEN 1 OF 1 COLLECTED 02/25/14): REACTIVE MESOTHELIAL CELLS.  Interval History:  She presents today alone for staple removal and post-operative follow up.  She states she has been doing well since surgery.  Tolerating PO intake with no nausea or emesis.  Ambulating without difficulty.  No vaginal bleeding reported.  Bowels and bladder functioning without difficulty.  No pain reported.  Incision healing well without drainage or erythema.  No concerns voiced.  Review of Systems Constitutional: Feels well.  No fever, chills, early satiety, unintentional weight loss or gain, change in appetite. Cardiovascular: No chest pain, shortness of breath, or edema.  Pulmonary: No cough or wheeze.  Gastrointestinal: No nausea, vomiting, or diarrhea. No bright red blood per rectum or change in bowel movement.  Genitourinary: No frequency, urgency, or dysuria. No vaginal bleeding or discharge.  Musculoskeletal: No myalgia or joint pain. Neurologic: No weakness, numbness, or change  in gait.  Psychology: No depression, anxiety, or insomnia.  Current Meds:  Outpatient Encounter Prescriptions as of  03/04/2014  Medication Sig  . amLODipine (NORVASC) 10 MG tablet Take 10 mg by mouth every morning.   . cloNIDine (CATAPRES) 0.1 MG tablet Take 0.1 mg by mouth 2 (two) times daily.   . hydrochlorothiazide (HYDRODIURIL) 25 MG tablet Take 25 mg by mouth every morning.   Marland Kitchen losartan (COZAAR) 100 MG tablet Take 100 mg by mouth every morning.   . metoprolol (LOPRESSOR) 50 MG tablet Take 50 mg by mouth 2 (two) times daily.  . Multiple Vitamin (MULTIVITAMIN WITH MINERALS) TABS tablet Take 1 tablet by mouth daily.    Allergy: No Known Allergies  Social Hx:   History   Social History  . Marital Status: Widowed    Spouse Name: N/A    Number of Children: N/A  . Years of Education: N/A   Occupational History  . Not on file.   Social History Main Topics  . Smoking status: Never Smoker   . Smokeless tobacco: Never Used  . Alcohol Use: No  . Drug Use: No  . Sexual Activity: Yes   Other Topics Concern  . Not on file   Social History Narrative  . No narrative on file    Past Surgical Hx:  Past Surgical History  Procedure Laterality Date  . Appendectomy    . Cholecystectomy    . Abdominal hysterectomy N/A 02/25/2014    Procedure: EXPLORATORY LAPAROTOMY, TOTAL HYSTERECTOMY ABDOMINAL,BILATERAL SALPINGO OOPHERECTOMY;  Surgeon: Alvino Chapel, MD;  Location: [facility];  Service: Gynecology;  Laterality: N/A;  ADMIT TO DR. [employer]   . Salpingoophorectomy Bilateral 02/25/2014    Procedure: BILATERAL SALPINGO OOPHORECTOMY;  Surgeon: Alvino Chapel, MD;  Location: [facility];  Service: Gynecology;  Laterality: Bilateral;    Past Medical Hx:  Past Medical History  Diagnosis Date  . Hypertension   . Ovarian cystic mass     PAIN RIGHT SIDE - MRI SHOWED PELVIC MASS;  PT STILLL HAS MENSTRUAL PERIODS - BUT IRREGULAR AND HEAVY BLEEDING WITH CYCLES    Family Hx:  Family History  Problem Relation Age of Onset  . Hypertension Mother   . Thyroid cancer Mother   . Hypertension  Father   . Kidney cancer Father   . Hypertension Sister   . Hypertension Brother     Vitals:  Blood pressure 168/83, pulse 63, temperature 97.7 F (36.5 C), temperature source Oral, resp. rate 16, weight 187 lb 9.6 oz (85.095 kg), last menstrual period 12/14/2013.  Physical Exam:  General: Well developed, well nourished female in no acute distress. Alert and oriented x 3.  Neck: Supple without any enlargements.  Lymph node survey: No cervical, supraclavicular, or inguinal adenopathy  Cardiovascular: Regular rate and rhythm. S1 and S2 normal.  Lungs: Clear to auscultation bilaterally. No wheezes/crackles/rhonchi noted.  Skin: No rashes or lesions present. Back: No CVA tenderness.  Abdomen: Abdomen soft, non-tender and obese. Active bowel sounds in all quadrants.  16 staples removed from the midline incision without difficulty by Armanda Magic, RN.  1/2 inch steri strips applied with no erythema, drainage, or incision separation noted.   Extremities: No bilateral cyanosis, edema, or clubbing.   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  She is doing well post-operatively.  Follow up appointment arranged with Dr. Fermin Schwab.  Post-operative restrictions along with incisional care reviewed.  Reportable signs and symptoms reviewed.  She is advised to call for any questions or concerns.   Dorothyann Gibbs, NP 03/04/2014, 2:45 PM

## 2014-03-04 NOTE — Patient Instructions (Signed)
Follow up with Dr. Fermin Schwab June 5. Call our office with any concerns.  You can remove the steri strips from your incision in one week.

## 2014-03-28 ENCOUNTER — Encounter: Payer: Self-pay | Admitting: Gynecology

## 2014-03-28 ENCOUNTER — Ambulatory Visit: Payer: PRIVATE HEALTH INSURANCE | Attending: Gynecology | Admitting: Gynecology

## 2014-03-28 VITALS — BP 169/80 | HR 63 | Temp 97.9°F | Resp 18 | Ht 62.05 in | Wt 187.9 lb

## 2014-03-28 DIAGNOSIS — I10 Essential (primary) hypertension: Secondary | ICD-10-CM | POA: Insufficient documentation

## 2014-03-28 DIAGNOSIS — N8000 Endometriosis of the uterus, unspecified: Secondary | ICD-10-CM

## 2014-03-28 DIAGNOSIS — D219 Benign neoplasm of connective and other soft tissue, unspecified: Secondary | ICD-10-CM | POA: Insufficient documentation

## 2014-03-28 DIAGNOSIS — D282 Benign neoplasm of uterine tubes and ligaments: Secondary | ICD-10-CM

## 2014-03-28 DIAGNOSIS — D279 Benign neoplasm of unspecified ovary: Secondary | ICD-10-CM

## 2014-03-28 DIAGNOSIS — R1900 Intra-abdominal and pelvic swelling, mass and lump, unspecified site: Secondary | ICD-10-CM | POA: Insufficient documentation

## 2014-03-28 DIAGNOSIS — Z79899 Other long term (current) drug therapy: Secondary | ICD-10-CM | POA: Insufficient documentation

## 2014-03-28 DIAGNOSIS — D259 Leiomyoma of uterus, unspecified: Secondary | ICD-10-CM

## 2014-03-28 DIAGNOSIS — Z90710 Acquired absence of both cervix and uterus: Secondary | ICD-10-CM | POA: Insufficient documentation

## 2014-03-28 DIAGNOSIS — D3911 Neoplasm of uncertain behavior of right ovary: Secondary | ICD-10-CM

## 2014-03-28 NOTE — Patient Instructions (Signed)
Follow up in 6 months with Dr. Fermin Schwab

## 2014-03-28 NOTE — Progress Notes (Signed)
Follow Up Note: Gyn-Onc  [name] 53 y.o. female  CC:  Chief Complaint  Patient presents with  . Pelvic Mass    Follow up    HPI:  Rebecca Mills is a 53 year old, gravida 3 para 3, last normal menstrual period 11/25/2013. Ms. January reports one year of menorrhagia associated with daily spotting between menses. She presented to Dr. Nyoka Cowden for evaluation of abnormal uterine bleeding. On 01/13/2014, she underwent an MRI. Findings were notable for a 10.7 x 7.6 x 8.2 cm multicystic mass in the right adnexal area. The largest cystic component measures 8.3 cm. 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 The left ovary measured 3.7 x 4.1 x 3.6 with numerous multiple follicles. In the left adnexa, there was no evidence for wall irregularity thickening or nodularity. The uterus was noted to measure 10.0 x 8.8 x 5.9 cm subserosal fibroids posteriorly.  On 01/23/2014, she underwent a hysteroscopy with D&C. The findings were notable for the uterus sounding to 10 cm. The cavitary appeared normal thin menopausal appearing with no endometrial polyps a small benign endocervical polyp was seen and removed. Pathology from that procedure was notable for inactive endometrium with stromal breakdown and endocervical polyp with tubal metaplasia. The endocervical curettage was negative for malignancy. Patient continued to have spotting.  CA 125 on 01/21/2014 returned to value of 28.2.  During her visit with Dr. Skeet Latch, she reported right sided pelvic pain for 1 year with the pain is worsening with exertion and heavy lifting. She denied associated nausea. She states there is some radiation of the pain to the right anterior thigh. She noted an intentional 6 pound weight loss and stated her appetite was good.  She had occasional bloating with no change in her bowel or bladder habits. Even since the hysteroscopy, she continues to have daily  bleeding.  On 02/25/14, she underwent a total abdominal hysterectomy, bilateral salpingo-oophorectomy, omental biopsy by Dr. Fermin Schwab.  Her post-operative course was uneventful.  Final pathology revealed: 1. 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 - PLEASE SEE COMMENT. 2. 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 - ENDOMETRIUM: BENIGN WEAKLY PROLIFERATIVE ENDOMETRIUM, NO ATYPIA, HYPERPLASIA OR MALIGNANCY. - CERVIX: BENIGN SQUAMOUS MUCOSA AND ENDOCERVICAL MUCOSA, NO DYSPLASIA OR MALIGNANCY.  3. Omentum, resection for tumor - MATURE ADIPOSE TISSUE WITH HEMORRHAGE. - NO EVIDENCE OF ATYPIA OR MALIGNANCY.  4. Ovary and fallopian tube, left - SEROUS BORDERLINE TUMOR, 3.5 CM. - BENIGN FALLOPIAN TUBAL TISSUE.  PERITONEAL WASHING, (SPECIMEN 1 OF 1 COLLECTED 02/25/14): REACTIVE MESOTHELIAL CELLS.  Interval History:  Patient returns today as previously scheduled for six-week postoperative checkup. Since her last visit she's done well. Her activity level was increased significantly. She driving the car again out of town. She has not had any abdominal pain. She denies any vaginal bleeding. Bowels and bladder functioning without difficulty.   No concerns voiced.  Review of Systems Constitutional: Feels well.  No fever, chills, early satiety, unintentional weight loss or gain, change in appetite. Cardiovascular: No chest pain, shortness of breath, or edema.  Pulmonary: No cough or wheeze.  Gastrointestinal: No nausea, vomiting, or diarrhea. No bright red blood per rectum or change in bowel movement.  Genitourinary: No frequency, urgency, or dysuria. No vaginal bleeding or discharge.  Musculoskeletal: No myalgia or joint pain. Neurologic: No weakness, numbness, or change in gait.  Psychology: No depression, anxiety, or insomnia.  Current Meds:  Outpatient Encounter Prescriptions as of 03/28/2014  Medication Sig  . amLODipine (NORVASC) 10 MG tablet  Take 10 mg by mouth every morning.   . cloNIDine (CATAPRES) 0.1 MG tablet Take 0.1 mg by mouth 2 (two) times daily.   . hydrochlorothiazide (HYDRODIURIL) 25 MG tablet Take 25 mg by mouth every morning.   Marland Kitchen losartan (COZAAR) 100 MG tablet Take 100 mg by mouth every morning.   . metoprolol (LOPRESSOR) 50 MG tablet Take 50 mg by mouth 2 (two) times daily.  . Multiple Vitamin (MULTIVITAMIN WITH MINERALS) TABS tablet Take 1 tablet by mouth daily.    Allergy: No Known Allergies  Social Hx:   History   Social History  . Marital Status: Widowed    Spouse Name: N/A    Number of Children: N/A  . Years of Education: N/A   Occupational History  . Not on file.   Social History Main Topics  . Smoking status: Never Smoker   . Smokeless tobacco: Never Used  . Alcohol Use: No  . Drug Use: No  . Sexual Activity: Yes   Other Topics Concern  . Not on file   Social History Narrative  . No narrative on file    Past Surgical Hx:  Past Surgical History  Procedure Laterality Date  . Appendectomy    . Cholecystectomy    . Abdominal hysterectomy N/A 02/25/2014    Procedure: EXPLORATORY LAPAROTOMY, TOTAL HYSTERECTOMY ABDOMINAL,BILATERAL SALPINGO OOPHERECTOMY;  Surgeon: Alvino Chapel, MD;  Location: [facility];  Service: Gynecology;  Laterality: N/A;  ADMIT TO DR. [employer]   . Salpingoophorectomy Bilateral 02/25/2014    Procedure: BILATERAL SALPINGO OOPHORECTOMY;  Surgeon: Alvino Chapel, MD;  Location: [facility];  Service: Gynecology;  Laterality: Bilateral;    Past Medical Hx:  Past Medical History  Diagnosis Date  . Hypertension   . Ovarian cystic mass     PAIN RIGHT SIDE - MRI SHOWED PELVIC MASS;  PT STILLL HAS MENSTRUAL PERIODS - BUT IRREGULAR AND HEAVY BLEEDING WITH CYCLES    Family Hx:  Family History  Problem Relation Age of Onset  . Hypertension Mother   . Thyroid cancer Mother   . Hypertension Father   . Kidney cancer Father   . Hypertension Sister   .  Hypertension Brother     Vitals:  Blood pressure 169/80, pulse 63, temperature 97.9 F (36.6 C), temperature source Oral, resp. rate 18, height 5' 2.05" (1.576 m), weight 187 lb 14.4 oz (85.231 kg).  Physical Exam:  General: Well developed, well nourished female in no acute distress. Alert and oriented x 3.  Neck: Supple without any enlargements.  Lymph node survey: No cervical, supraclavicular, or inguinal adenopathy  Cardiovascular: Regular rate and rhythm. S1 and S2 normal.  Lungs: Clear to auscultation bilaterally. No wheezes/crackles/rhonchi noted.  Skin: No rashes or lesions present. Back: No CVA tenderness.  Abdomen: Abdomen soft, non-tender and obese. Active bowel sounds in all quadrants.  Midline incision is healing well.  Pelvic exam  EGBUS normal  Vagina without lesions and vaginal cuff healing well. Cervix and uterus surgically absent bimanual exam reveals no masses induration or nodularity or pain.  Extremities: No bilateral cyanosis, edema, or clubbing.   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  She's had an excellent postoperative recovery and  return to full levels of activity. Reportable signs and symptoms reviewed.  She is advised to call for any questions or concerns. She return in 6 months. It's noted that her CA 125 was normal preoperatively and therefore we will not monitor it in the future.   Alvino Chapel, NP 03/28/2014, 9:50 AM

## 2014-09-29 ENCOUNTER — Ambulatory Visit: Payer: PRIVATE HEALTH INSURANCE | Admitting: Gynecology

## 2014-10-02 ENCOUNTER — Telehealth: Payer: Self-pay | Admitting: *Deleted

## 2014-10-02 NOTE — Telephone Encounter (Signed)
Attempted to reach pt several times today to rescheduled her cancelled appt on 09/29/14. Left VM for her to call us back to reschedule. Also attempted to reach pt at work and they state she is no longer employed there.

## 2014-10-14 NOTE — Telephone Encounter (Signed)
Attempted to reach patient again. No answer. Called and spoke with Octavia Bruckner, patient's son and emergency contact. He said his mom is at work right now but he was agreeable to give her our clinic phone number so she can reschedule her missed appointment.

## 2015-09-25 IMAGING — CR DG CHEST 2V
2 series · 2 of 2 positions shown · non-contrast
Comparison: None.

CLINICAL DATA: Preop for hysterectomy.  Treated hypertension.

EXAM:
CHEST  2 VIEW

[w chest pa]
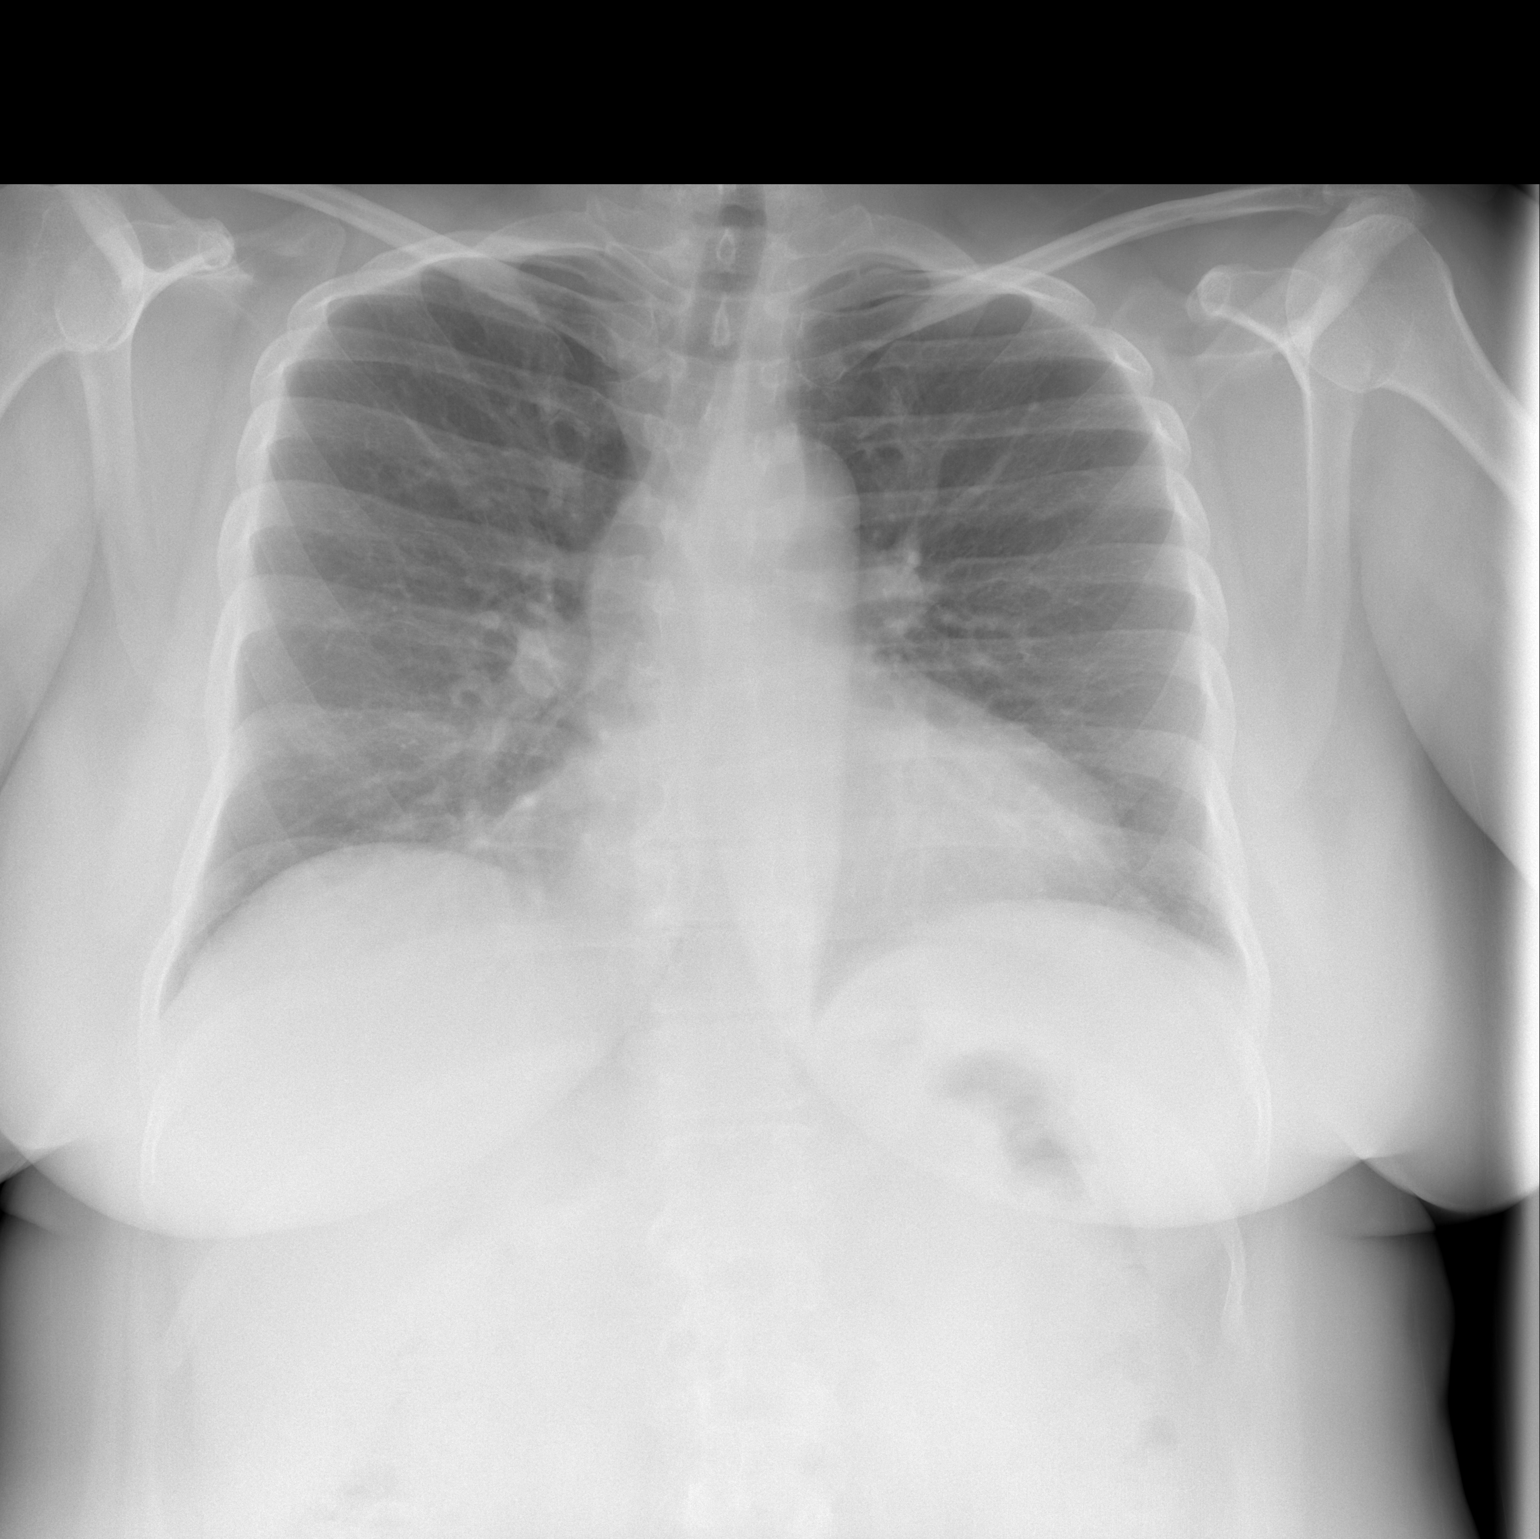

[w chest lat]
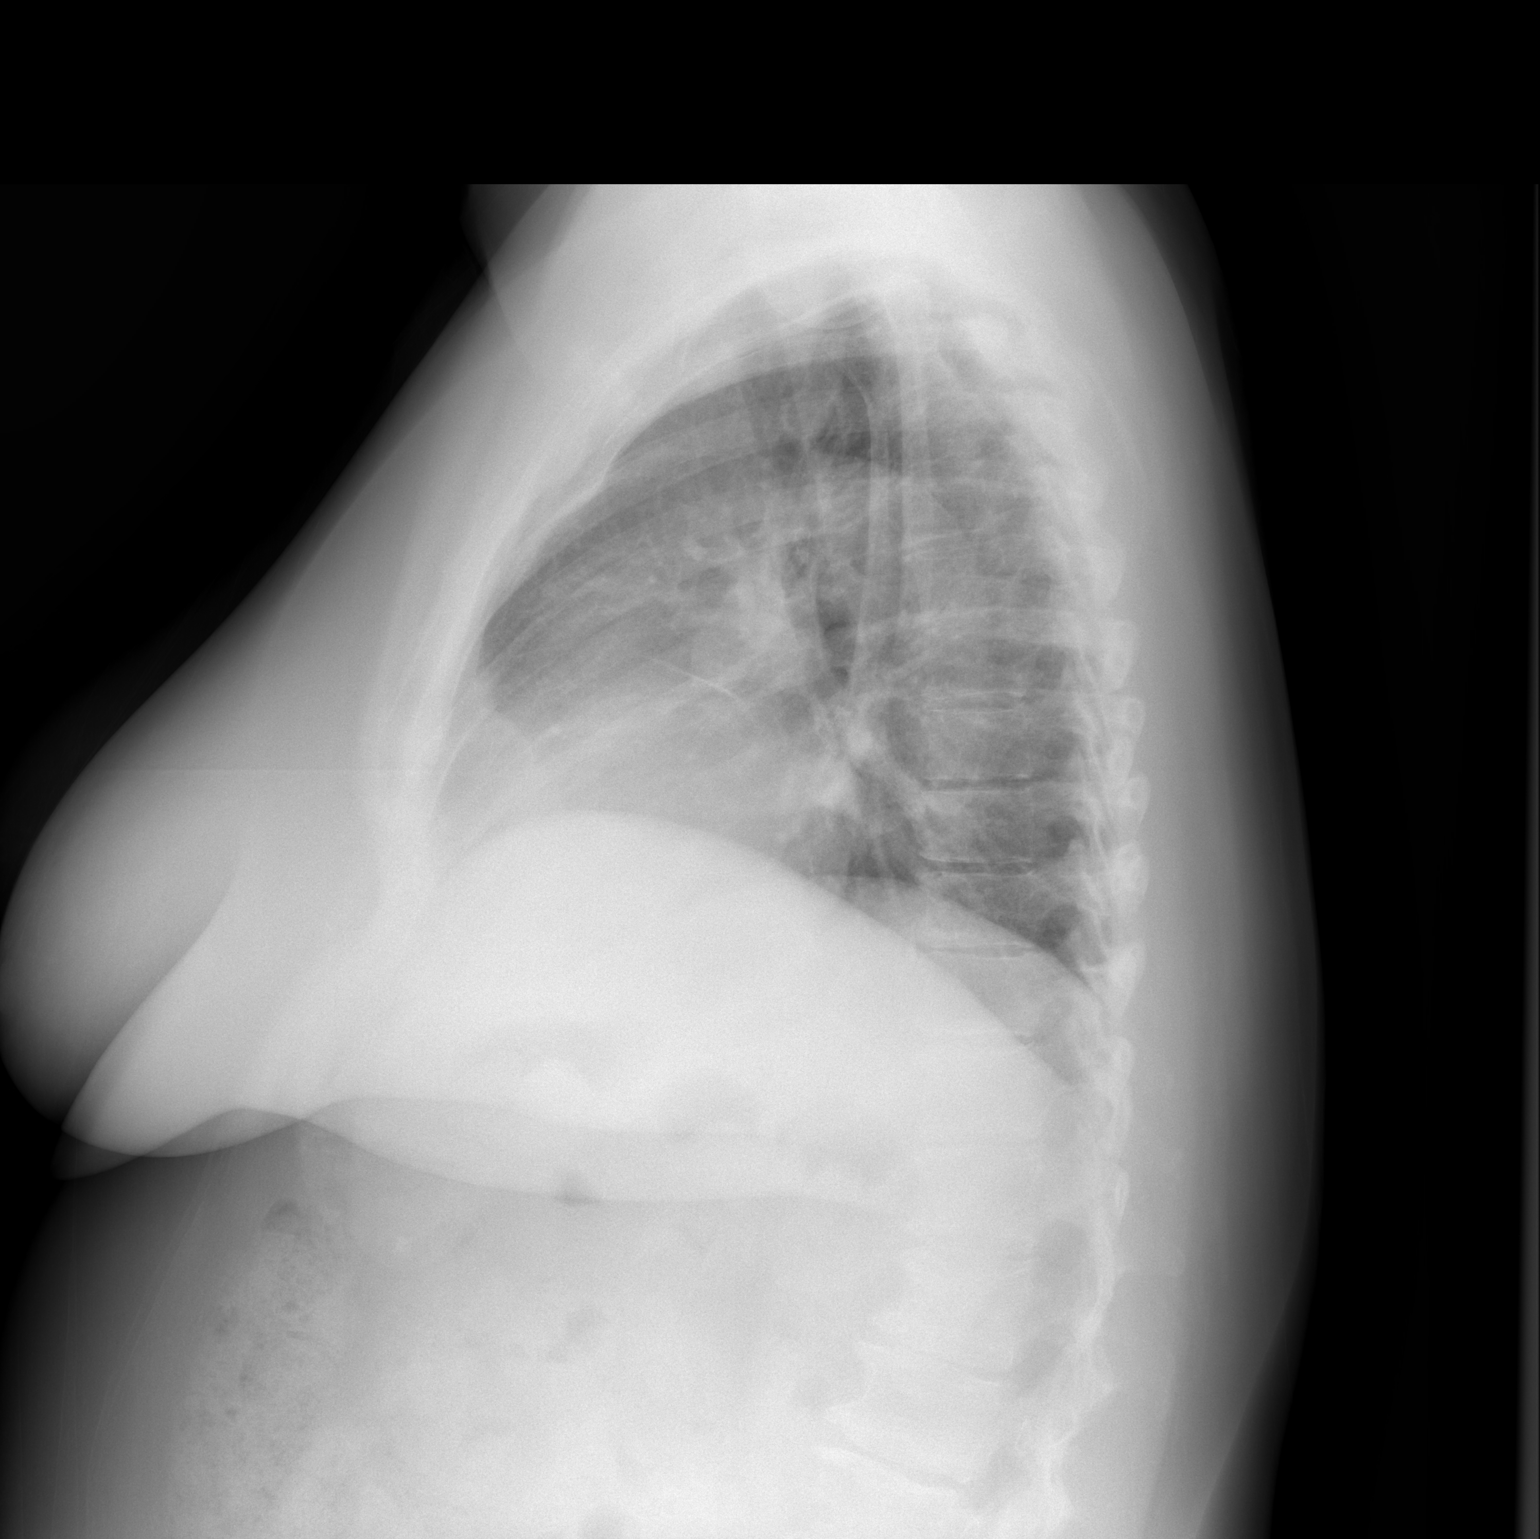

[2 of 2 positions shown; findings below may reference images not displayed]

FINDINGS: The heart size and mediastinal contours are within normal limits.
Both lungs are clear. The bony thorax is intact.
IMPRESSION: No active cardiopulmonary disease.
